# Patient Record
Sex: Female | Born: 1985 | Race: White | Hispanic: No | Marital: Married | Smoking: Never smoker
Health system: Southern US, Community
[De-identification: ages and names within clinical notes are randomized; demographics above are authoritative.]

## PROBLEM LIST (undated history)

## (undated) DIAGNOSIS — R634 Abnormal weight loss: Secondary | ICD-10-CM

## (undated) DIAGNOSIS — F32A Depression, unspecified: Secondary | ICD-10-CM

## (undated) DIAGNOSIS — R4189 Other symptoms and signs involving cognitive functions and awareness: Secondary | ICD-10-CM

## (undated) DIAGNOSIS — R42 Dizziness and giddiness: Secondary | ICD-10-CM

## (undated) DIAGNOSIS — R61 Generalized hyperhidrosis: Secondary | ICD-10-CM

## (undated) DIAGNOSIS — D649 Anemia, unspecified: Secondary | ICD-10-CM

## (undated) DIAGNOSIS — R197 Diarrhea, unspecified: Secondary | ICD-10-CM

## (undated) DIAGNOSIS — Z8659 Personal history of other mental and behavioral disorders: Secondary | ICD-10-CM

## (undated) DIAGNOSIS — R5383 Other fatigue: Secondary | ICD-10-CM

## (undated) DIAGNOSIS — R110 Nausea: Secondary | ICD-10-CM

## (undated) DIAGNOSIS — F419 Anxiety disorder, unspecified: Secondary | ICD-10-CM

## (undated) DIAGNOSIS — R0602 Shortness of breath: Secondary | ICD-10-CM

## (undated) DIAGNOSIS — G4700 Insomnia, unspecified: Secondary | ICD-10-CM

## (undated) DIAGNOSIS — R631 Polydipsia: Secondary | ICD-10-CM

## (undated) DIAGNOSIS — R531 Weakness: Secondary | ICD-10-CM

## (undated) HISTORY — DX: Insomnia, unspecified: G47.00

## (undated) HISTORY — DX: Generalized hyperhidrosis: R61

## (undated) HISTORY — DX: Polydipsia: R63.1

## (undated) HISTORY — DX: Diarrhea, unspecified: R19.7

## (undated) HISTORY — DX: Abnormal weight loss: R63.4

## (undated) HISTORY — DX: Weakness: R53.1

## (undated) HISTORY — DX: Other fatigue: R53.83

## (undated) HISTORY — PX: WISDOM TOOTH EXTRACTION: SHX21

## (undated) HISTORY — DX: Other symptoms and signs involving cognitive functions and awareness: R41.89

## (undated) HISTORY — DX: Dizziness and giddiness: R42

## (undated) HISTORY — DX: Shortness of breath: R06.02

## (undated) HISTORY — PX: INTRAOCULAR LENS INSERTION: SHX110

## (undated) HISTORY — DX: Depression, unspecified: F32.A

## (undated) HISTORY — DX: Nausea: R11.0

---

## 2010-11-09 ENCOUNTER — Other Ambulatory Visit (HOSPITAL_COMMUNITY)
Admission: RE | Admit: 2010-11-09 | Discharge: 2010-11-09 | Disposition: A | Discharge status: Home / Self Care | Payer: Self-pay | Source: Ambulatory Visit | Attending: Family Medicine | Admitting: Family Medicine

## 2010-11-09 DIAGNOSIS — Z124 Encounter for screening for malignant neoplasm of cervix: Secondary | ICD-10-CM | POA: Insufficient documentation

## 2011-12-04 ENCOUNTER — Other Ambulatory Visit (HOSPITAL_COMMUNITY)
Admission: RE | Admit: 2011-12-04 | Discharge: 2011-12-04 | Disposition: A | Discharge status: Home / Self Care | Payer: BC Managed Care – PPO | Source: Ambulatory Visit | Attending: Family Medicine | Admitting: Family Medicine

## 2011-12-04 DIAGNOSIS — Z124 Encounter for screening for malignant neoplasm of cervix: Secondary | ICD-10-CM | POA: Insufficient documentation

## 2016-11-08 ENCOUNTER — Encounter (HOSPITAL_COMMUNITY): Payer: Self-pay | Admitting: *Deleted

## 2016-11-08 ENCOUNTER — Ambulatory Visit (HOSPITAL_COMMUNITY)
Admission: EM | Admit: 2016-11-08 | Discharge: 2016-11-08 | Disposition: A | Discharge status: Home / Self Care | Payer: Self-pay | Attending: Family Medicine | Admitting: Family Medicine

## 2016-11-08 DIAGNOSIS — R202 Paresthesia of skin: Secondary | ICD-10-CM

## 2016-11-08 DIAGNOSIS — R200 Anesthesia of skin: Secondary | ICD-10-CM

## 2016-11-08 DIAGNOSIS — S7002XA Contusion of left hip, initial encounter: Secondary | ICD-10-CM

## 2016-11-08 DIAGNOSIS — S161XXA Strain of muscle, fascia and tendon at neck level, initial encounter: Secondary | ICD-10-CM

## 2016-11-08 HISTORY — DX: Anxiety disorder, unspecified: F41.9

## 2016-11-08 MED ORDER — CYCLOBENZAPRINE HCL 5 MG PO TABS: 5.0000 mg | ORAL_TABLET | Freq: Three times a day (TID) | ORAL | 0 refills | Status: DC | PRN

## 2016-11-08 MED ORDER — PREDNISONE 20 MG PO TABS: ORAL_TABLET | ORAL | 0 refills | Status: DC
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
## 2016-11-08 NOTE — Discharge Instructions (Signed)
You appear to have some mild trochanteric bursitis along with muscle straining her neck. Usually problems like the numbness in your forearm gradually resolve over 1-2 weeks.
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
## 2018-11-01 DIAGNOSIS — Z6827 Body mass index (BMI) 27.0-27.9, adult: Secondary | ICD-10-CM | POA: Diagnosis not present

## 2018-11-01 DIAGNOSIS — Z136 Encounter for screening for cardiovascular disorders: Secondary | ICD-10-CM | POA: Diagnosis not present

## 2018-11-01 DIAGNOSIS — Z13220 Encounter for screening for lipoid disorders: Secondary | ICD-10-CM | POA: Diagnosis not present

## 2018-11-01 DIAGNOSIS — Z713 Dietary counseling and surveillance: Secondary | ICD-10-CM | POA: Diagnosis not present

## 2019-04-15 DIAGNOSIS — Z6826 Body mass index (BMI) 26.0-26.9, adult: Secondary | ICD-10-CM | POA: Diagnosis not present

## 2019-04-15 DIAGNOSIS — Z01419 Encounter for gynecological examination (general) (routine) without abnormal findings: Secondary | ICD-10-CM | POA: Diagnosis not present

## 2019-04-15 DIAGNOSIS — Z1389 Encounter for screening for other disorder: Secondary | ICD-10-CM | POA: Diagnosis not present

## 2019-04-15 DIAGNOSIS — Z30432 Encounter for removal of intrauterine contraceptive device: Secondary | ICD-10-CM | POA: Diagnosis not present

## 2019-04-15 DIAGNOSIS — Z130 Encounter for screening for diseases of the blood and blood-forming organs and certain disorders involving the immune mechanism: Secondary | ICD-10-CM | POA: Diagnosis not present

## 2019-05-12 DIAGNOSIS — Z0189 Encounter for other specified special examinations: Secondary | ICD-10-CM | POA: Diagnosis not present

## 2019-05-12 DIAGNOSIS — R319 Hematuria, unspecified: Secondary | ICD-10-CM | POA: Diagnosis not present

## 2019-05-20 ENCOUNTER — Other Ambulatory Visit (HOSPITAL_COMMUNITY)
Admission: RE | Admit: 2019-05-20 | Discharge: 2019-05-20 | Disposition: A | Discharge status: Home / Self Care | Payer: BC Managed Care – PPO | Source: Ambulatory Visit | Attending: Obstetrics and Gynecology | Admitting: Obstetrics and Gynecology

## 2019-05-20 DIAGNOSIS — Z01812 Encounter for preprocedural laboratory examination: Secondary | ICD-10-CM | POA: Diagnosis not present

## 2019-05-20 DIAGNOSIS — Z20828 Contact with and (suspected) exposure to other viral communicable diseases: Secondary | ICD-10-CM | POA: Diagnosis not present

## 2019-05-20 LAB — SARS CORONAVIRUS 2 (TAT 6-24 HRS): SARS Coronavirus 2: NEGATIVE

## 2019-05-22 ENCOUNTER — Encounter (HOSPITAL_BASED_OUTPATIENT_CLINIC_OR_DEPARTMENT_OTHER): Payer: Self-pay | Admitting: *Deleted

## 2019-05-22 ENCOUNTER — Other Ambulatory Visit: Payer: Self-pay

## 2019-05-22 NOTE — Progress Notes (Signed)
Spoke w/ pt via phone for pre-op interview.  Npo after mn.  Arrive at 0730.  Needs cbc and urine preg.  Pt had covid test done 05-20-2019.

## 2019-05-22 NOTE — H&P (Signed)
Joyce Thompson is an 33 y.o. female. She was seen for annual exam in July, had Mirena removed, wants permanent sterility, does not want children.  Pertinent Gynecological History: Last pap: normal Date: 2017 OB History: G0, P0   Menstrual History: Patient's last menstrual period was 05/09/2019 (exact date).    Past Medical History:  Diagnosis Date  . Anemia   . Anxiety   . History of panic attacks     Past Surgical History:  Procedure Laterality Date  . INTRAOCULAR LENS INSERTION Bilateral 2012  approx.  . WISDOM TOOTH EXTRACTION  age 9    History reviewed. No pertinent family history.  Social History:  reports that she has never smoked. She has never used smokeless tobacco. She reports current drug use. Drug: Marijuana. She reports that she does not drink alcohol.  Allergies:  Allergies  Allergen Reactions  . Codeine Itching    "keeps me awake"    No medications prior to admission.    Review of Systems  Respiratory: Negative.   Cardiovascular: Negative.     Height 5\' 9"  (1.753 m), weight 80.7 kg, last menstrual period 05/09/2019. Physical Exam  Constitutional: She appears well-developed and well-nourished.  Neck: Neck supple. No thyromegaly present.  Cardiovascular: Normal rate, regular rhythm and normal heart sounds.  No murmur heard. Respiratory: Effort normal and breath sounds normal. No respiratory distress. She has no wheezes.  GI: Soft. She exhibits no distension and no mass. There is no abdominal tenderness.  Genitourinary:    Vagina and uterus normal.     Genitourinary Comments: No adnexal mass     No results found for this or any previous visit (from the past 24 hour(s)).  No results found.  Assessment/Plan: Desires permanent sterility.  Discussed all options for contraception, surgical procedure, risks, permanency, failure rate.  Will admit for laparoscopic bilateral salpingectomy  Blane Ohara Drey Shaff 05/22/2019, 10:36 PM

## 2019-05-22 NOTE — Anesthesia Preprocedure Evaluation (Addendum)
Anesthesia Evaluation  Patient identified by MRN, date of birth, ID band Patient awake    Reviewed: Allergy & Precautions, NPO status , Patient's Chart, lab work & pertinent test results  Airway Mallampati: II  TM Distance: >3 FB Neck ROM: Full    Dental no notable dental hx. (+) Teeth Intact   Pulmonary neg pulmonary ROS,    Pulmonary exam normal breath sounds clear to auscultation       Cardiovascular Exercise Tolerance: Good negative cardio ROS Normal cardiovascular exam Rhythm:Regular Rate:Normal     Neuro/Psych negative neurological ROS  negative psych ROS   GI/Hepatic negative GI ROS, Neg liver ROS,   Endo/Other  negative endocrine ROS  Renal/GU negative Renal ROS     Musculoskeletal   Abdominal   Peds  Hematology  (+) anemia ,   Anesthesia Other Findings   Reproductive/Obstetrics                            Anesthesia Physical Anesthesia Plan  ASA: I  Anesthesia Plan: General   Post-op Pain Management:    Induction: Intravenous  PONV Risk Score and Plan: 3 and Treatment may vary due to age or medical condition, Dexamethasone and Ondansetron  Airway Management Planned: Oral ETT  Additional Equipment:   Intra-op Plan:   Post-operative Plan: Extubation in OR  Informed Consent: I have reviewed the patients History and Physical, chart, labs and discussed the procedure including the risks, benefits and alternatives for the proposed anesthesia with the patient or authorized representative who has indicated his/her understanding and acceptance.     Dental advisory given  Plan Discussed with:   Anesthesia Plan Comments:        Anesthesia Quick Evaluation

## 2019-05-23 ENCOUNTER — Encounter (HOSPITAL_BASED_OUTPATIENT_CLINIC_OR_DEPARTMENT_OTHER): Payer: Self-pay | Admitting: *Deleted

## 2019-05-23 ENCOUNTER — Ambulatory Visit (HOSPITAL_BASED_OUTPATIENT_CLINIC_OR_DEPARTMENT_OTHER)
Admission: RE | Admit: 2019-05-23 | Discharge: 2019-05-23 | Disposition: A | Discharge status: Home / Self Care | Payer: BC Managed Care – PPO | Attending: Obstetrics and Gynecology | Admitting: Obstetrics and Gynecology

## 2019-05-23 ENCOUNTER — Encounter (HOSPITAL_BASED_OUTPATIENT_CLINIC_OR_DEPARTMENT_OTHER)
Admission: RE | Disposition: A | Discharge status: Home / Self Care | Payer: Self-pay | Source: Home / Self Care | Attending: Obstetrics and Gynecology

## 2019-05-23 ENCOUNTER — Ambulatory Visit (HOSPITAL_BASED_OUTPATIENT_CLINIC_OR_DEPARTMENT_OTHER): Payer: BC Managed Care – PPO | Admitting: Anesthesiology

## 2019-05-23 ENCOUNTER — Other Ambulatory Visit: Payer: Self-pay

## 2019-05-23 DIAGNOSIS — Z885 Allergy status to narcotic agent status: Secondary | ICD-10-CM | POA: Insufficient documentation

## 2019-05-23 DIAGNOSIS — Z302 Encounter for sterilization: Secondary | ICD-10-CM | POA: Diagnosis not present

## 2019-05-23 HISTORY — DX: Personal history of other mental and behavioral disorders: Z86.59

## 2019-05-23 HISTORY — PX: LAPAROSCOPIC BILATERAL SALPINGECTOMY: SHX5889

## 2019-05-23 HISTORY — DX: Anemia, unspecified: D64.9

## 2019-05-23 LAB — CBC
HCT: 38.9 % (ref 36.0–46.0)
Hemoglobin: 12.8 g/dL (ref 12.0–15.0)
MCH: 30.8 pg (ref 26.0–34.0)
MCHC: 32.9 g/dL (ref 30.0–36.0)
MCV: 93.5 fL (ref 80.0–100.0)
Platelets: 210 10*3/uL (ref 150–400)
RBC: 4.16 MIL/uL (ref 3.87–5.11)
RDW: 13.4 % (ref 11.5–15.5)
WBC: 6.1 10*3/uL (ref 4.0–10.5)
nRBC: 0 % (ref 0.0–0.2)

## 2019-05-23 LAB — POCT PREGNANCY, URINE: Preg Test, Ur: NEGATIVE

## 2019-05-23 SURGERY — SALPINGECTOMY, BILATERAL, LAPAROSCOPIC
Anesthesia: General | Site: Abdomen | Laterality: Bilateral

## 2019-05-23 MED ORDER — LACTATED RINGERS IV SOLN
INTRAVENOUS | Status: DC
  Administered 2019-05-23: 08:00:00 via INTRAVENOUS
  Filled 2019-05-23: qty 1000

## 2019-05-23 MED ORDER — MEPERIDINE HCL 25 MG/ML IJ SOLN
6.2500 mg | INTRAMUSCULAR | Status: DC | PRN
  Filled 2019-05-23: qty 1

## 2019-05-23 MED ORDER — ACETAMINOPHEN 10 MG/ML IV SOLN
1000.0000 mg | Freq: Once | INTRAVENOUS | Status: DC | PRN
  Filled 2019-05-23: qty 100

## 2019-05-23 MED ORDER — HYDROMORPHONE HCL 1 MG/ML IJ SOLN
0.2500 mg | INTRAMUSCULAR | Status: DC | PRN
  Filled 2019-05-23: qty 0.5

## 2019-05-23 MED ORDER — ONDANSETRON HCL 4 MG/2ML IJ SOLN
INTRAMUSCULAR | Status: DC | PRN
  Administered 2019-05-23: 4 mg via INTRAVENOUS

## 2019-05-23 MED ORDER — DEXMEDETOMIDINE HCL 200 MCG/2ML IV SOLN
INTRAVENOUS | Status: DC | PRN
  Administered 2019-05-23 (×4): 8 ug via INTRAVENOUS

## 2019-05-23 MED ORDER — LIDOCAINE 2% (20 MG/ML) 5 ML SYRINGE
INTRAMUSCULAR | Status: DC | PRN
  Administered 2019-05-23: 80 mg via INTRAVENOUS

## 2019-05-23 MED ORDER — PROPOFOL 10 MG/ML IV BOLUS
INTRAVENOUS | Status: DC | PRN
  Administered 2019-05-23: 150 mg via INTRAVENOUS

## 2019-05-23 MED ORDER — DEXAMETHASONE SODIUM PHOSPHATE 10 MG/ML IJ SOLN
INTRAMUSCULAR | Status: AC
  Filled 2019-05-23: qty 1

## 2019-05-23 MED ORDER — LIDOCAINE 2% (20 MG/ML) 5 ML SYRINGE
INTRAMUSCULAR | Status: AC
  Filled 2019-05-23: qty 5

## 2019-05-23 MED ORDER — FENTANYL CITRATE (PF) 100 MCG/2ML IJ SOLN
INTRAMUSCULAR | Status: AC
  Filled 2019-05-23: qty 2

## 2019-05-23 MED ORDER — ONDANSETRON HCL 4 MG/2ML IJ SOLN
INTRAMUSCULAR | Status: AC
  Filled 2019-05-23: qty 2

## 2019-05-23 MED ORDER — KETOROLAC TROMETHAMINE 30 MG/ML IJ SOLN
INTRAMUSCULAR | Status: DC | PRN
  Administered 2019-05-23: 30 mg via INTRAVENOUS

## 2019-05-23 MED ORDER — KETOROLAC TROMETHAMINE 30 MG/ML IJ SOLN
INTRAMUSCULAR | Status: AC
  Filled 2019-05-23: qty 1

## 2019-05-23 MED ORDER — WHITE PETROLATUM EX OINT
TOPICAL_OINTMENT | CUTANEOUS | Status: AC
  Filled 2019-05-23: qty 5

## 2019-05-23 MED ORDER — LACTATED RINGERS IV SOLN
INTRAVENOUS | Status: DC
  Administered 2019-05-23: 11:00:00 via INTRAVENOUS
  Filled 2019-05-23: qty 1000

## 2019-05-23 MED ORDER — ACETAMINOPHEN 500 MG PO TABS
ORAL_TABLET | ORAL | Status: AC
  Filled 2019-05-23: qty 2

## 2019-05-23 MED ORDER — GLYCOPYRROLATE PF 0.2 MG/ML IJ SOSY
PREFILLED_SYRINGE | INTRAMUSCULAR | Status: AC
  Filled 2019-05-23: qty 1

## 2019-05-23 MED ORDER — ACETAMINOPHEN 500 MG PO TABS
1000.0000 mg | ORAL_TABLET | Freq: Once | ORAL | Status: AC
  Administered 2019-05-23: 08:00:00 1000 mg via ORAL
  Filled 2019-05-23: qty 2

## 2019-05-23 MED ORDER — HYDROCODONE-ACETAMINOPHEN 7.5-325 MG PO TABS
1.0000 | ORAL_TABLET | Freq: Once | ORAL | Status: DC | PRN
  Filled 2019-05-23: qty 1

## 2019-05-23 MED ORDER — MIDAZOLAM HCL 2 MG/2ML IJ SOLN
INTRAMUSCULAR | Status: DC | PRN
  Administered 2019-05-23: 2 mg via INTRAVENOUS

## 2019-05-23 MED ORDER — FENTANYL CITRATE (PF) 100 MCG/2ML IJ SOLN
INTRAMUSCULAR | Status: DC | PRN
  Administered 2019-05-23 (×2): 50 ug via INTRAVENOUS

## 2019-05-23 MED ORDER — SUGAMMADEX SODIUM 200 MG/2ML IV SOLN
INTRAVENOUS | Status: DC | PRN
  Administered 2019-05-23: 160 mg via INTRAVENOUS

## 2019-05-23 MED ORDER — MIDAZOLAM HCL 2 MG/2ML IJ SOLN
INTRAMUSCULAR | Status: AC
  Filled 2019-05-23: qty 2

## 2019-05-23 MED ORDER — DEXAMETHASONE SODIUM PHOSPHATE 10 MG/ML IJ SOLN
INTRAMUSCULAR | Status: DC | PRN
  Administered 2019-05-23: 5 mg via INTRAVENOUS

## 2019-05-23 MED ORDER — ROCURONIUM BROMIDE 10 MG/ML (PF) SYRINGE
PREFILLED_SYRINGE | INTRAVENOUS | Status: AC
  Filled 2019-05-23: qty 10

## 2019-05-23 MED ORDER — BUPIVACAINE HCL (PF) 0.25 % IJ SOLN
INTRAMUSCULAR | Status: DC | PRN
  Administered 2019-05-23: 5 mL

## 2019-05-23 MED ORDER — GLYCOPYRROLATE PF 0.2 MG/ML IJ SOSY
PREFILLED_SYRINGE | INTRAMUSCULAR | Status: DC | PRN
  Administered 2019-05-23: .2 mg via INTRAVENOUS

## 2019-05-23 MED ORDER — PROMETHAZINE HCL 25 MG/ML IJ SOLN
6.2500 mg | INTRAMUSCULAR | Status: DC | PRN
  Filled 2019-05-23: qty 1

## 2019-05-23 MED ORDER — ROCURONIUM BROMIDE 10 MG/ML (PF) SYRINGE
PREFILLED_SYRINGE | INTRAVENOUS | Status: DC | PRN
  Administered 2019-05-23: 40 mg via INTRAVENOUS

## 2019-05-23 SURGICAL SUPPLY — 31 items
BAG RETRIEVAL 10MM (BASKET)
CATH ROBINSON RED A/P 16FR (CATHETERS) ×3 IMPLANT
COVER WAND RF STERILE (DRAPES) ×3 IMPLANT
DERMABOND ADVANCED (GAUZE/BANDAGES/DRESSINGS) ×2
DERMABOND ADVANCED .7 DNX12 (GAUZE/BANDAGES/DRESSINGS) ×1 IMPLANT
DRSG COVADERM PLUS 2X2 (GAUZE/BANDAGES/DRESSINGS) IMPLANT
DRSG OPSITE POSTOP 3X4 (GAUZE/BANDAGES/DRESSINGS) IMPLANT
DURAPREP 26ML APPLICATOR (WOUND CARE) ×3 IMPLANT
GAUZE 4X4 16PLY RFD (DISPOSABLE) ×3 IMPLANT
GLOVE BIOGEL PI IND STRL 8 (GLOVE) ×1 IMPLANT
GLOVE BIOGEL PI INDICATOR 8 (GLOVE) ×2
GLOVE ORTHO TXT STRL SZ7.5 (GLOVE) ×3 IMPLANT
GOWN STRL REUS W/TWL XL LVL3 (GOWN DISPOSABLE) ×3 IMPLANT
NEEDLE INSUFFLATION 120MM (ENDOMECHANICALS) ×3 IMPLANT
NS IRRIG 1000ML POUR BTL (IV SOLUTION) ×3 IMPLANT
PACK LAPAROSCOPY BASIN (CUSTOM PROCEDURE TRAY) ×3 IMPLANT
PACK TRENDGUARD 450 HYBRID PRO (MISCELLANEOUS) IMPLANT
PROTECTOR NERVE ULNAR (MISCELLANEOUS) ×3 IMPLANT
SET IRRIG TUBING LAPAROSCOPIC (IRRIGATION / IRRIGATOR) ×3 IMPLANT
SHEARS HARMONIC ACE PLUS 36CM (ENDOMECHANICALS) IMPLANT
SUT VICRYL 0 UR6 27IN ABS (SUTURE) IMPLANT
SUT VICRYL 4-0 PS2 18IN ABS (SUTURE) ×3 IMPLANT
SYS BAG RETRIEVAL 10MM (BASKET)
SYSTEM BAG RETRIEVAL 10MM (BASKET) IMPLANT
TOWEL OR 17X26 10 PK STRL BLUE (TOWEL DISPOSABLE) ×3 IMPLANT
TRENDGUARD 450 HYBRID PRO PACK (MISCELLANEOUS)
TROCAR BLADELESS OPT 5 100 (ENDOMECHANICALS) ×3 IMPLANT
TROCAR XCEL BLUNT TIP 100MML (ENDOMECHANICALS) IMPLANT
TROCAR XCEL NON-BLD 11X100MML (ENDOMECHANICALS) ×3 IMPLANT
TUBING EVAC SMOKE HEATED PNEUM (TUBING) ×3 IMPLANT
WARMER LAPAROSCOPE (MISCELLANEOUS) ×3 IMPLANT

## 2019-05-23 NOTE — Anesthesia Procedure Notes (Signed)
Procedure Name: Intubation Date/Time: 05/23/2019 9:13 AM Performed by: Suan Halter, CRNA Pre-anesthesia Checklist: Patient identified, Emergency Drugs available, Suction available and Patient being monitored Patient Re-evaluated:Patient Re-evaluated prior to induction Oxygen Delivery Method: Circle system utilized Preoxygenation: Pre-oxygenation with 100% oxygen Induction Type: IV induction Ventilation: Mask ventilation without difficulty Laryngoscope Size: Mac and 3 Grade View: Grade I Tube type: Oral Tube size: 7.0 mm Number of attempts: 1 Airway Equipment and Method: Stylet and Oral airway Placement Confirmation: ETT inserted through vocal cords under direct vision,  positive ETCO2 and breath sounds checked- equal and bilateral Secured at: 23 cm Tube secured with: Tape Dental Injury: Teeth and Oropharynx as per pre-operative assessment

## 2019-05-23 NOTE — Anesthesia Postprocedure Evaluation (Signed)
Anesthesia Post Note  Patient: Damika Harmon  Procedure(s) Performed: LAPAROSCOPIC BILATERAL SALPINGECTOMY (Bilateral Abdomen)     Patient location during evaluation: PACU Anesthesia Type: General Level of consciousness: awake and alert Pain management: pain level controlled Vital Signs Assessment: post-procedure vital signs reviewed and stable Respiratory status: spontaneous breathing, nonlabored ventilation, respiratory function stable and patient connected to nasal cannula oxygen Cardiovascular status: blood pressure returned to baseline and stable Postop Assessment: no apparent nausea or vomiting Anesthetic complications: no    Last Vitals:  Vitals:   05/23/19 1045 05/23/19 1115  BP: 112/75 118/83  Pulse: (!) 58 (!) 56  Resp: 12 16  Temp:    SpO2: 100% 100%    Last Pain:  Vitals:   05/23/19 1115  TempSrc:   PainSc: 4                  Barnet Glasgow

## 2019-05-23 NOTE — Discharge Instructions (Signed)
Routine instructions for laparoscopy  DISCHARGE INSTRUCTIONS: Laparoscopy  The following instructions have been prepared to help you care for yourself upon your return home today.  Wound care:  Do not get the incision wet for the first 24 hours. The incision should be kept clean and dry.  The Band-Aids or dressings may be removed the day after surgery.  Should the incision become sore, red, and swollen after the first week, check with your doctor.  Personal hygiene:  Shower the day after your procedure.  Activity and limitations:  Do NOT drive or operate any equipment today.  Do NOT lift anything more than 15 pounds for 2-3 weeks after surgery.  Do NOT rest in bed all day.  Walking is encouraged. Walk each day, starting slowly with 5-minute walks 3 or 4 times a day. Slowly increase the length of your walks.  Walk up and down stairs slowly.  Do NOT do strenuous activities, such as golfing, playing tennis, bowling, running, biking, weight lifting, gardening, mowing, or vacuuming for 2-4 weeks. Ask your doctor when it is okay to start.  Diet: Eat a light meal as desired this evening. You may resume your usual diet tomorrow.  Return to work: This is dependent on the type of work you do. For the most part you can return to a desk job within a week of surgery. If you are more active at work, please discuss this with your doctor.  What to expect after your surgery: You may have a slight burning sensation when you urinate on the first day. You may have a very small amount of blood in the urine. Expect to have a small amount of vaginal discharge/light bleeding for 1-2 weeks. It is not unusual to have abdominal soreness and bruising for up to 2 weeks. You may be tired and need more rest for about 1 week. You may experience shoulder pain for 24-72 hours. Lying flat in bed may relieve it.  Call your doctor for any of the following:  Develop a fever of 100.4 or greater  Inability to  urinate 6 hours after discharge from hospital  Severe pain not relieved by pain medications  Persistent of heavy bleeding at incision site  Redness or swelling around incision site after a week  Increasing nausea or vomiting  Patient Signature________________________________________ Nurse Signature_________________________________________     Post Anesthesia Home Care Instructions  Activity: Get plenty of rest for the remainder of the day. A responsible individual must stay with you for 24 hours following the procedure.  For the next 24 hours, DO NOT: -Drive a car -Advertising copywriterperate machinery -Drink alcoholic beverages -Take any medication unless instructed by your physician -Make any legal decisions or sign important papers.  Meals: Start with liquid foods such as gelatin or soup. Progress to regular foods as tolerated. Avoid greasy, spicy, heavy foods. If nausea and/or vomiting occur, drink only clear liquids until the nausea and/or vomiting subsides. Call your physician if vomiting continues.  Special Instructions/Symptoms: Your throat may feel dry or sore from the anesthesia or the breathing tube placed in your throat during surgery. If this causes discomfort, gargle with warm salt water. The discomfort should disappear within 24 hours.  If you had a scopolamine patch placed behind your ear for the management of post- operative nausea and/or vomiting:  1. The medication in the patch is effective for 72 hours, after which it should be removed.  Wrap patch in a tissue and discard in the trash. Wash hands thoroughly with  soap and water. 2. You may remove the patch earlier than 72 hours if you experience unpleasant side effects which may include dry mouth, dizziness or visual disturbances. 3. Avoid touching the patch. Wash your hands with soap and water after contact with the patch.     NO IBUPROFEN PRODUCTS (MOTRIN, ADVIL) OR ALEVE UNTIL 4:00PM TODAY.

## 2019-05-23 NOTE — Transfer of Care (Signed)
Immediate Anesthesia Transfer of Care Note  Patient: Joyce Thompson  Procedure(s) Performed: Procedure(s) (LRB): LAPAROSCOPIC BILATERAL SALPINGECTOMY (Bilateral)  Patient Location: PACU  Anesthesia Type: General  Level of Consciousness: awake, oriented, sedated and patient cooperative  Airway & Oxygen Therapy: Patient Spontanous Breathing and Patient connected to face mask oxygen  Post-op Assessment: Report given to PACU RN and Post -op Vital signs reviewed and stable  Post vital signs: Reviewed and stable  Complications: No apparent anesthesia complications  Last Vitals:  Vitals Value Taken Time  BP 113/76 05/23/19 1015  Temp    Pulse 66 05/23/19 1017  Resp 14 05/23/19 1017  SpO2 100 % 05/23/19 1017  Vitals shown include unvalidated device data.  Last Pain:  Vitals:   05/23/19 0738  TempSrc: Oral

## 2019-05-23 NOTE — Op Note (Signed)
Preoperative diagnosis: Desires surgical sterility Postoperative diagnosis: Same Procedure: Laparoscopic bilateral salpingectomy Surgeon: Cheri Fowler M.D. Anesthesia: Gen. Endotracheal tube Findings: She had a normal abdomen and pelvis with normal uterus tubes and ovaries Specimens: Bilateral fallopian tubes Estimated blood loss: Minimal Complications: None  Procedure in detail  The patient was taken to the operating room and placed in the dorsosupine position. General anesthesia was induced. Her legs were placed in mobile stirrups and her left arm was tucked to her side. Abdomen perineum and vagina were then prepped and draped in the usual sterile fashion, bladder drained with a red robinson catheter, a Hulka tenaculum was applied to the cervix for uterine manipulation. Infraumbilical skin was then infiltrated with quarter percent Marcaine and a 1 cm vertical incision was made. The veress needle was inserted into the peritoneal cavity and placement confirmed by the water drop test and an opening pressure of 7 mm of mercury. CO2 was insufflated to a pressure of 14 mm of mercury and the veress needle was removed. A 10/11 disposable trocar was then introduced with direct visualization with the laparoscope. A 5 mm port was then placed low in the midline also under direct visualization. Inspection revealed the above-mentioned findings with normal anatomy. The distal end of each tube was grasped and elevated. Using bipolar cautery I was able to free the distal end of each tube from the ovary and fulgurate across the mesosalpinx and a proximal portion of the fallopian tube. Scissors were then used to remove the fallopian tube. This is done bilaterally without difficulty. Both segments of tube were removed through the 73mm trocar. The 5 mm port was removed under direct visualization. All gas was allowed to deflate from the abdomen and the umbilical trocar was removed. One figure-of-eight suture of 0 Vicryl  was placed in the umbilical incision. Skin incisions were then closed with interrupted subcuticular sutures of 4-0 Vicryl followed by Dermabond. The Hulka tenaculum was removed. The patient was taken down from stirrups. She was awakened in the operating room and taken to the recovery room in stable condition after tolerating the procedure well. Counts were correct and she had PAS hose on throughout the procedure.

## 2019-05-23 NOTE — Interval H&P Note (Signed)
History and Physical Interval Note:  05/23/2019 8:54 AM  Joyce Thompson  has presented today for surgery, with the diagnosis of sterilization.  The various methods of treatment have been discussed with the patient and family. After consideration of risks, benefits and other options for treatment, the patient has consented to  Procedure(s): LAPAROSCOPIC BILATERAL SALPINGECTOMY (Bilateral) as a surgical intervention.  The patient's history has been reviewed, patient examined, no change in status, stable for surgery.  I have reviewed the patient's chart and labs.  Questions were answered to the patient's satisfaction.     Blane Ohara Afrah Burlison

## 2019-05-26 ENCOUNTER — Encounter (HOSPITAL_BASED_OUTPATIENT_CLINIC_OR_DEPARTMENT_OTHER): Payer: Self-pay | Admitting: Obstetrics and Gynecology

## 2020-03-17 DIAGNOSIS — M5412 Radiculopathy, cervical region: Secondary | ICD-10-CM | POA: Diagnosis not present

## 2020-03-17 DIAGNOSIS — M5416 Radiculopathy, lumbar region: Secondary | ICD-10-CM | POA: Diagnosis not present

## 2020-03-23 ENCOUNTER — Other Ambulatory Visit: Payer: Self-pay | Admitting: Family Medicine

## 2020-03-23 DIAGNOSIS — M5412 Radiculopathy, cervical region: Secondary | ICD-10-CM

## 2020-03-23 DIAGNOSIS — M5416 Radiculopathy, lumbar region: Secondary | ICD-10-CM

## 2020-03-29 DIAGNOSIS — M542 Cervicalgia: Secondary | ICD-10-CM | POA: Diagnosis not present

## 2020-03-29 DIAGNOSIS — M545 Low back pain: Secondary | ICD-10-CM | POA: Diagnosis not present

## 2020-04-24 ENCOUNTER — Ambulatory Visit
Admission: RE | Admit: 2020-04-24 | Discharge: 2020-04-24 | Disposition: A | Discharge status: Home / Self Care | Payer: BC Managed Care – PPO | Source: Ambulatory Visit | Attending: Family Medicine | Admitting: Family Medicine

## 2020-04-24 DIAGNOSIS — M4802 Spinal stenosis, cervical region: Secondary | ICD-10-CM | POA: Diagnosis not present

## 2020-04-24 DIAGNOSIS — M5412 Radiculopathy, cervical region: Secondary | ICD-10-CM

## 2020-04-24 DIAGNOSIS — R200 Anesthesia of skin: Secondary | ICD-10-CM | POA: Diagnosis not present

## 2020-04-24 DIAGNOSIS — M5416 Radiculopathy, lumbar region: Secondary | ICD-10-CM

## 2020-04-24 DIAGNOSIS — M5117 Intervertebral disc disorders with radiculopathy, lumbosacral region: Secondary | ICD-10-CM | POA: Diagnosis not present

## 2020-04-24 DIAGNOSIS — M4050 Lordosis, unspecified, site unspecified: Secondary | ICD-10-CM | POA: Diagnosis not present

## 2020-04-26 DIAGNOSIS — M542 Cervicalgia: Secondary | ICD-10-CM | POA: Diagnosis not present

## 2020-04-26 DIAGNOSIS — L309 Dermatitis, unspecified: Secondary | ICD-10-CM | POA: Diagnosis not present

## 2020-04-26 DIAGNOSIS — M545 Low back pain: Secondary | ICD-10-CM | POA: Diagnosis not present

## 2020-05-04 DIAGNOSIS — M5412 Radiculopathy, cervical region: Secondary | ICD-10-CM | POA: Diagnosis not present

## 2020-05-04 DIAGNOSIS — M5416 Radiculopathy, lumbar region: Secondary | ICD-10-CM | POA: Diagnosis not present

## 2020-05-11 DIAGNOSIS — M5412 Radiculopathy, cervical region: Secondary | ICD-10-CM | POA: Diagnosis not present

## 2020-05-13 DIAGNOSIS — M5416 Radiculopathy, lumbar region: Secondary | ICD-10-CM | POA: Diagnosis not present

## 2020-05-18 DIAGNOSIS — M5412 Radiculopathy, cervical region: Secondary | ICD-10-CM | POA: Diagnosis not present

## 2020-05-20 DIAGNOSIS — M5416 Radiculopathy, lumbar region: Secondary | ICD-10-CM | POA: Diagnosis not present

## 2020-05-24 DIAGNOSIS — L308 Other specified dermatitis: Secondary | ICD-10-CM | POA: Diagnosis not present

## 2020-05-25 DIAGNOSIS — M5412 Radiculopathy, cervical region: Secondary | ICD-10-CM | POA: Diagnosis not present

## 2020-05-27 DIAGNOSIS — M5416 Radiculopathy, lumbar region: Secondary | ICD-10-CM | POA: Diagnosis not present

## 2020-06-01 DIAGNOSIS — M5412 Radiculopathy, cervical region: Secondary | ICD-10-CM | POA: Diagnosis not present

## 2020-06-03 DIAGNOSIS — M5416 Radiculopathy, lumbar region: Secondary | ICD-10-CM | POA: Diagnosis not present

## 2020-06-09 DIAGNOSIS — M5412 Radiculopathy, cervical region: Secondary | ICD-10-CM | POA: Diagnosis not present

## 2020-06-14 DIAGNOSIS — M5416 Radiculopathy, lumbar region: Secondary | ICD-10-CM | POA: Diagnosis not present

## 2020-06-16 DIAGNOSIS — L309 Dermatitis, unspecified: Secondary | ICD-10-CM | POA: Diagnosis not present

## 2020-06-24 DIAGNOSIS — M5416 Radiculopathy, lumbar region: Secondary | ICD-10-CM | POA: Diagnosis not present

## 2020-06-29 DIAGNOSIS — M5416 Radiculopathy, lumbar region: Secondary | ICD-10-CM | POA: Diagnosis not present

## 2020-07-06 DIAGNOSIS — M5416 Radiculopathy, lumbar region: Secondary | ICD-10-CM | POA: Diagnosis not present

## 2020-07-13 DIAGNOSIS — M5416 Radiculopathy, lumbar region: Secondary | ICD-10-CM | POA: Diagnosis not present

## 2020-07-20 DIAGNOSIS — M5416 Radiculopathy, lumbar region: Secondary | ICD-10-CM | POA: Diagnosis not present

## 2020-07-26 DIAGNOSIS — M5416 Radiculopathy, lumbar region: Secondary | ICD-10-CM | POA: Diagnosis not present

## 2020-10-19 DIAGNOSIS — F331 Major depressive disorder, recurrent, moderate: Secondary | ICD-10-CM | POA: Diagnosis not present

## 2021-01-14 IMAGING — MR MR LUMBAR SPINE W/O CM
4 of 5 series · 18 of 48 positions shown · non-contrast
Comparison: None.

CLINICAL DATA: Low back pain extending into left lower extremity.
Pain and numbness for 2 years.

EXAM:
MRI LUMBAR SPINE WITHOUT CONTRAST
TECHNIQUE: Multiplanar, multisequence MR imaging of the lumbar spine was
performed. No intravenous contrast was administered.

[Series 6: T2 · sagittal · 4.0mm · 0.73mm/px · 6 of 15 slices shown (1 of 2)]
[im 1/15]
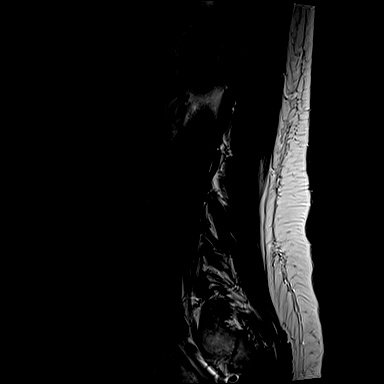
[im 3/15]
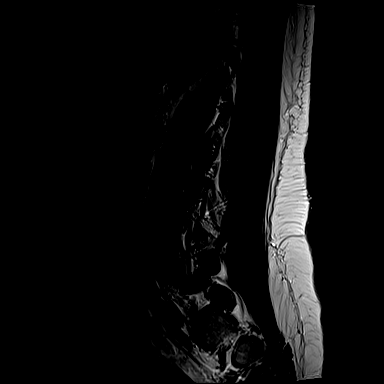
[im 6/15]
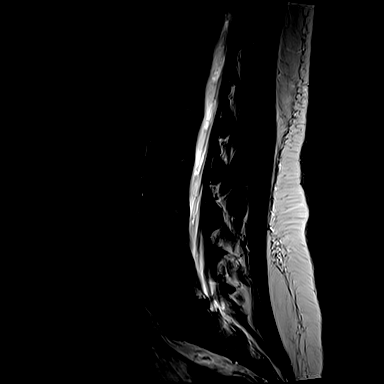
[im 9/15]
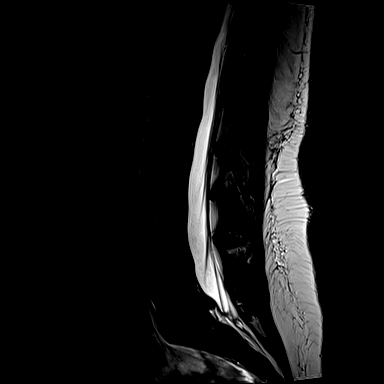
[im 12/15]
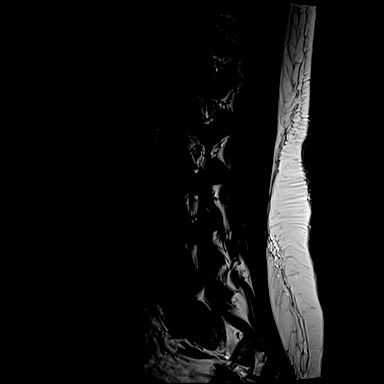
[im 15/15]
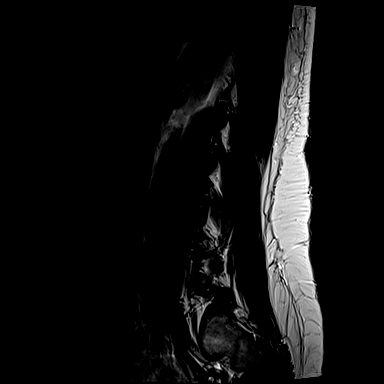

[Series 7: T1 · sagittal · 4.0mm · 0.73mm/px · 3 of 15 slices shown (1 of 2)]
[im 3/15]
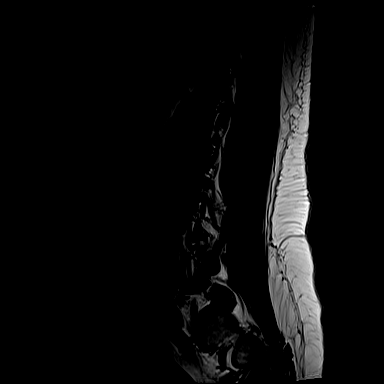
[im 9/15]
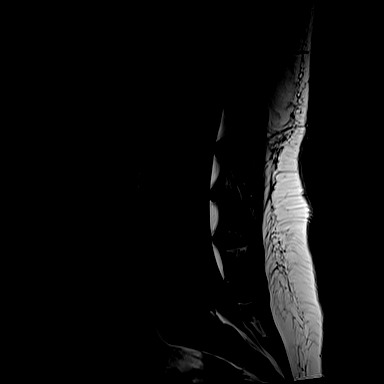
[im 15/15]
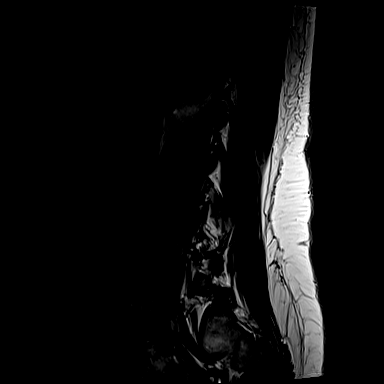

[Series 14: T2 · axial · 4.0mm · 0.28mm/px · z∈[-64,+131]mm · 6 of 44 slices shown (2 of 2)]
[im 3/44]
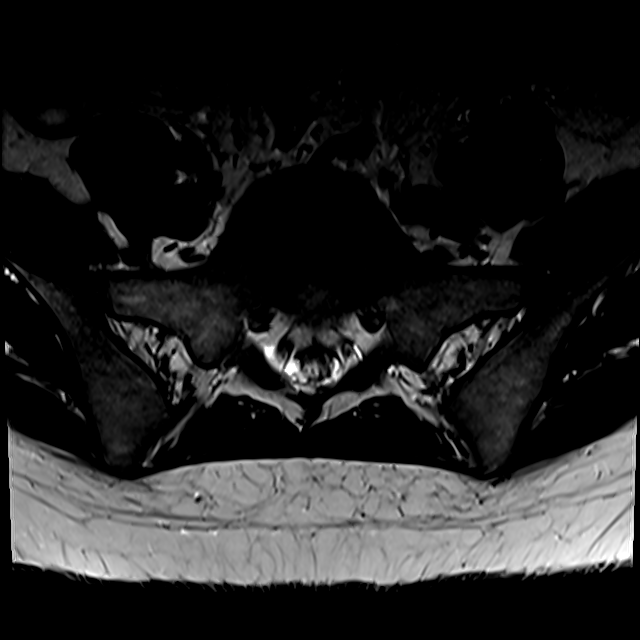
[im 6/44]
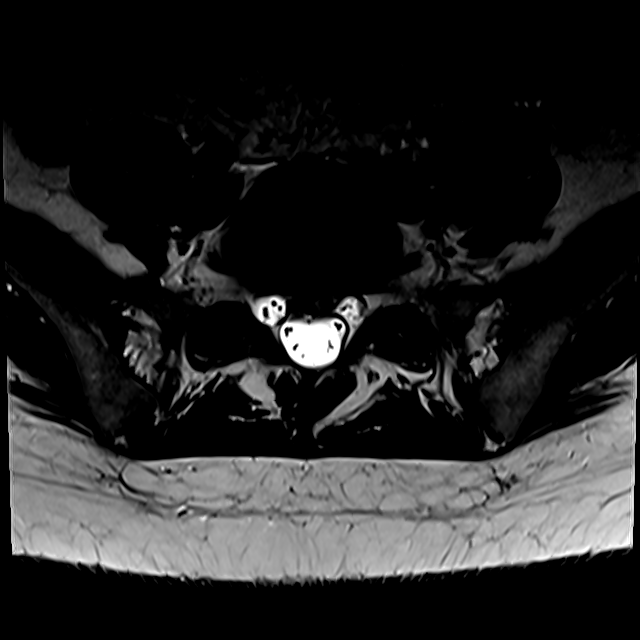
[im 9/44]
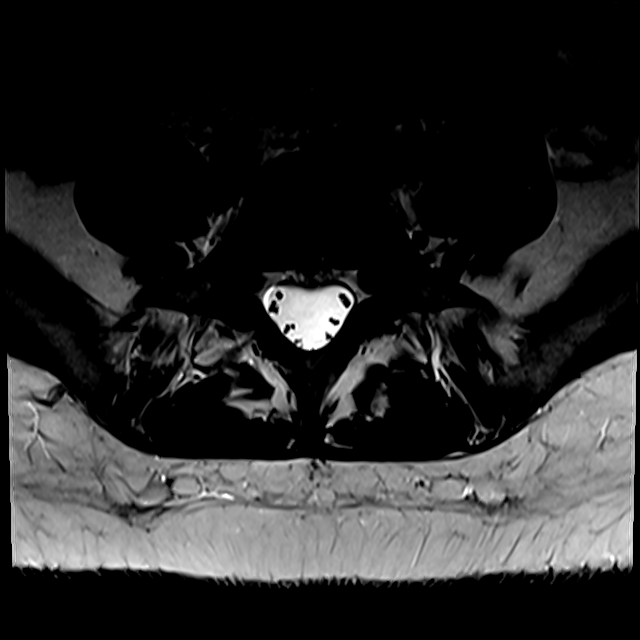
[im 14/44]
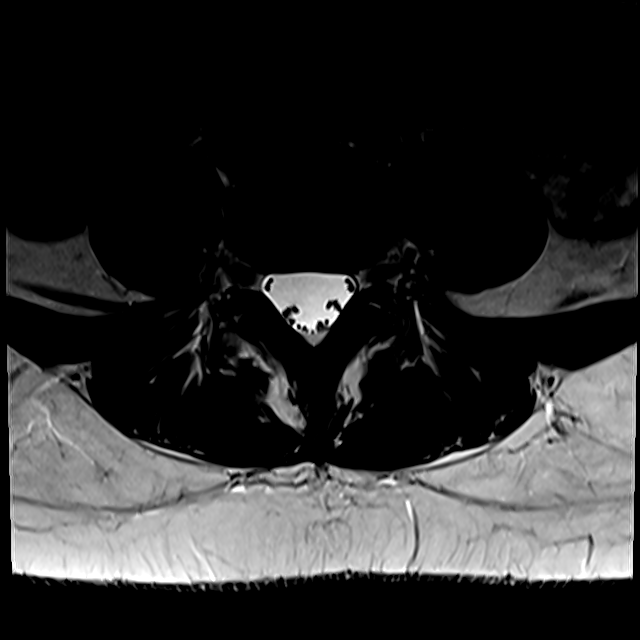
[im 22/44]
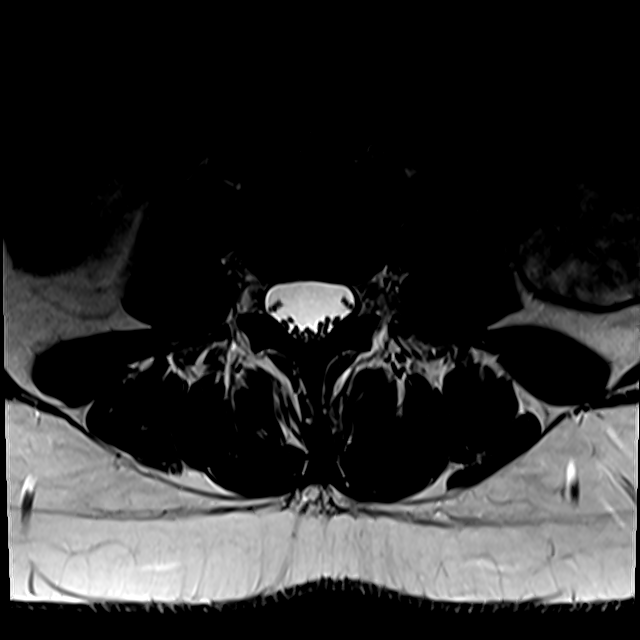
[im 38/44]
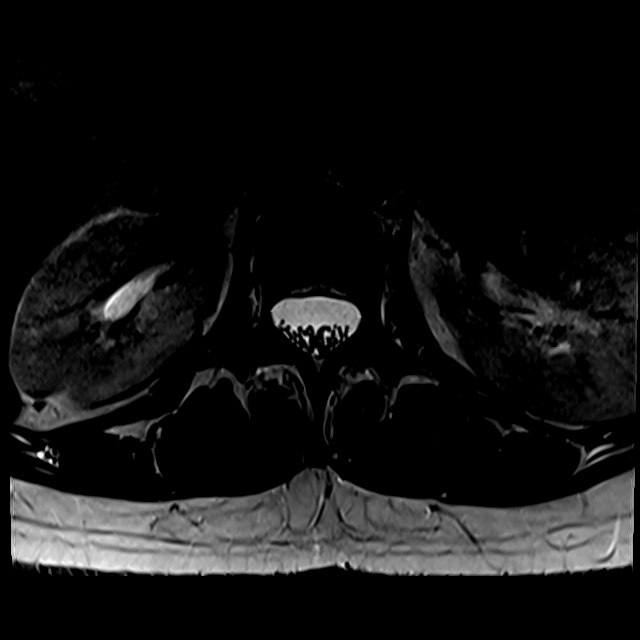

[Series 100: T1 · axial · 4.0mm · 0.28mm/px · z∈[-19,+131]mm · 3 of 32 slices shown (2 of 2)]
[im 6/32]
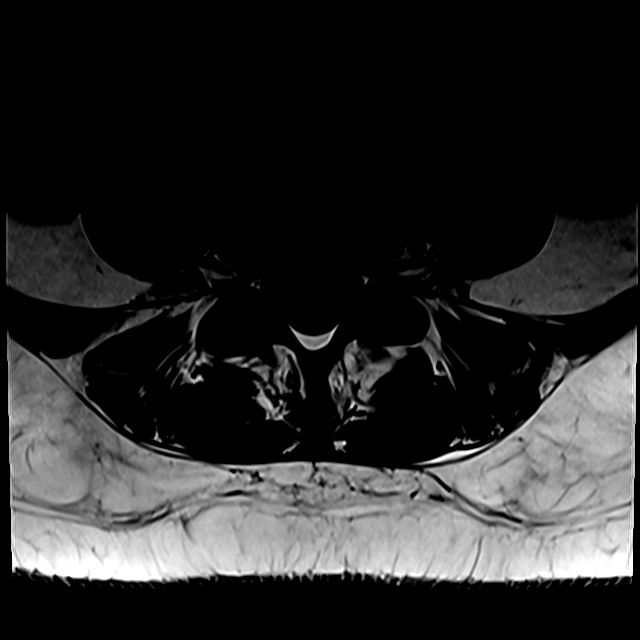
[im 16/32]
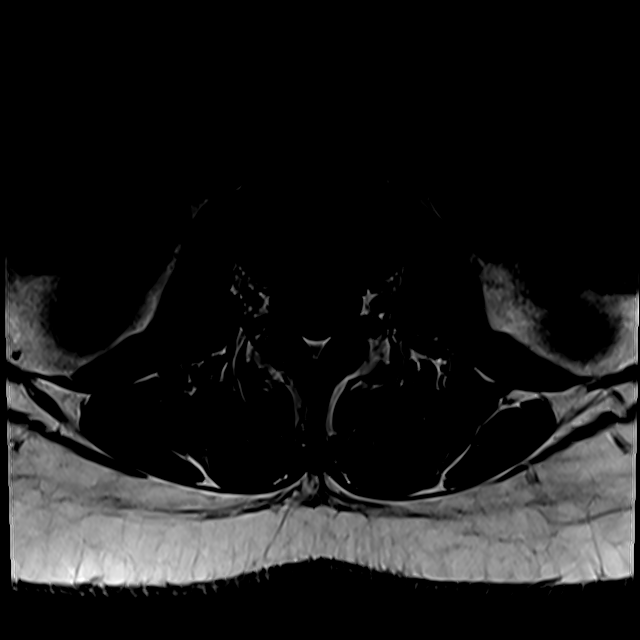
[im 26/32]
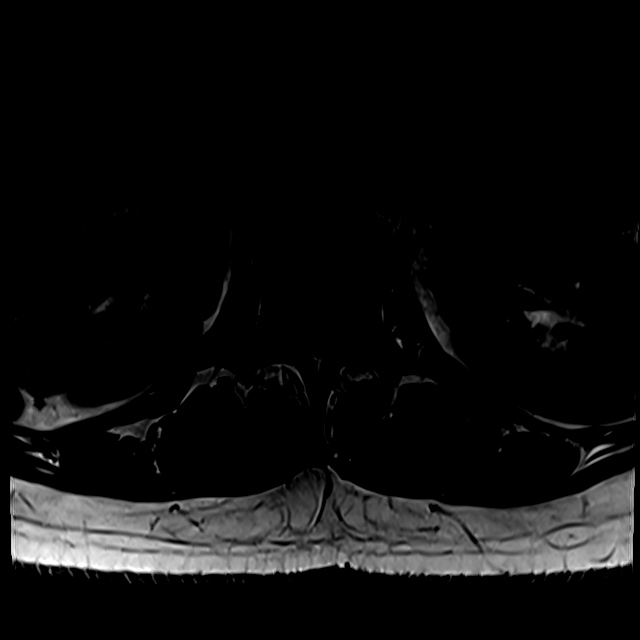

[18 of 48 positions shown; findings below may reference images not displayed]

FINDINGS: Segmentation: 5 non rib-bearing lumbar type vertebral bodies are
present. The lowest fully formed vertebral body is L5.

Alignment: No significant listhesis is present. Lumbar lordosis is
preserved.

Vertebrae:  Mild endplate marrow changes are present at L5-S1.

Conus medullaris and cauda equina: Conus extends to the L1 level.
Conus and cauda equina appear normal.

Paraspinal and other soft tissues: Limited imaging the abdomen is
unremarkable. There is no significant adenopathy. No solid organ
lesions are present.

Disc levels:

Disc levels at L3-4 and above are normal. Minimal desiccation is
present at L4-5 without significant protrusion or stenosis.

L5-S1: A prominent central disc protrusion is present. There is
potential contact of the S1 nerve roots bilaterally. Foramina are
patent bilaterally.
IMPRESSION: 1. Prominent central disc protrusion at L5-S1 with potential contact
of the S1 nerve roots bilaterally.
2. No other significant disc disease.

## 2021-03-02 DIAGNOSIS — F4310 Post-traumatic stress disorder, unspecified: Secondary | ICD-10-CM | POA: Diagnosis not present

## 2021-03-15 DIAGNOSIS — F4310 Post-traumatic stress disorder, unspecified: Secondary | ICD-10-CM | POA: Diagnosis not present

## 2021-03-29 DIAGNOSIS — F4310 Post-traumatic stress disorder, unspecified: Secondary | ICD-10-CM | POA: Diagnosis not present

## 2021-04-13 DIAGNOSIS — F4310 Post-traumatic stress disorder, unspecified: Secondary | ICD-10-CM | POA: Diagnosis not present

## 2021-04-20 DIAGNOSIS — F4310 Post-traumatic stress disorder, unspecified: Secondary | ICD-10-CM | POA: Diagnosis not present

## 2021-05-09 DIAGNOSIS — F4310 Post-traumatic stress disorder, unspecified: Secondary | ICD-10-CM | POA: Diagnosis not present

## 2021-05-18 DIAGNOSIS — F4310 Post-traumatic stress disorder, unspecified: Secondary | ICD-10-CM | POA: Diagnosis not present

## 2021-06-02 DIAGNOSIS — F411 Generalized anxiety disorder: Secondary | ICD-10-CM | POA: Diagnosis not present

## 2021-06-02 DIAGNOSIS — K30 Functional dyspepsia: Secondary | ICD-10-CM | POA: Diagnosis not present

## 2021-06-02 DIAGNOSIS — F410 Panic disorder [episodic paroxysmal anxiety] without agoraphobia: Secondary | ICD-10-CM | POA: Diagnosis not present

## 2021-06-03 DIAGNOSIS — F4310 Post-traumatic stress disorder, unspecified: Secondary | ICD-10-CM | POA: Diagnosis not present

## 2021-06-08 DIAGNOSIS — F4310 Post-traumatic stress disorder, unspecified: Secondary | ICD-10-CM | POA: Diagnosis not present

## 2021-06-21 DIAGNOSIS — F4310 Post-traumatic stress disorder, unspecified: Secondary | ICD-10-CM | POA: Diagnosis not present

## 2021-06-29 DIAGNOSIS — F4310 Post-traumatic stress disorder, unspecified: Secondary | ICD-10-CM | POA: Diagnosis not present

## 2021-07-04 DIAGNOSIS — F411 Generalized anxiety disorder: Secondary | ICD-10-CM | POA: Diagnosis not present

## 2021-07-04 DIAGNOSIS — F410 Panic disorder [episodic paroxysmal anxiety] without agoraphobia: Secondary | ICD-10-CM | POA: Diagnosis not present

## 2021-07-04 DIAGNOSIS — Z23 Encounter for immunization: Secondary | ICD-10-CM | POA: Diagnosis not present

## 2021-07-04 DIAGNOSIS — N943 Premenstrual tension syndrome: Secondary | ICD-10-CM | POA: Diagnosis not present

## 2021-07-05 DIAGNOSIS — F4310 Post-traumatic stress disorder, unspecified: Secondary | ICD-10-CM | POA: Diagnosis not present

## 2021-07-19 DIAGNOSIS — F4310 Post-traumatic stress disorder, unspecified: Secondary | ICD-10-CM | POA: Diagnosis not present

## 2021-07-26 DIAGNOSIS — Z124 Encounter for screening for malignant neoplasm of cervix: Secondary | ICD-10-CM | POA: Diagnosis not present

## 2021-07-26 DIAGNOSIS — Z01419 Encounter for gynecological examination (general) (routine) without abnormal findings: Secondary | ICD-10-CM | POA: Diagnosis not present

## 2021-07-26 DIAGNOSIS — Z130 Encounter for screening for diseases of the blood and blood-forming organs and certain disorders involving the immune mechanism: Secondary | ICD-10-CM | POA: Diagnosis not present

## 2021-07-26 DIAGNOSIS — Z1151 Encounter for screening for human papillomavirus (HPV): Secondary | ICD-10-CM | POA: Diagnosis not present

## 2021-07-27 DIAGNOSIS — F4310 Post-traumatic stress disorder, unspecified: Secondary | ICD-10-CM | POA: Diagnosis not present

## 2021-08-16 DIAGNOSIS — F4312 Post-traumatic stress disorder, chronic: Secondary | ICD-10-CM | POA: Diagnosis not present

## 2021-08-23 DIAGNOSIS — F4312 Post-traumatic stress disorder, chronic: Secondary | ICD-10-CM | POA: Diagnosis not present

## 2021-09-05 DIAGNOSIS — F4312 Post-traumatic stress disorder, chronic: Secondary | ICD-10-CM | POA: Diagnosis not present

## 2021-09-19 DIAGNOSIS — F4312 Post-traumatic stress disorder, chronic: Secondary | ICD-10-CM | POA: Diagnosis not present

## 2021-09-20 DIAGNOSIS — U071 COVID-19: Secondary | ICD-10-CM | POA: Diagnosis not present

## 2021-09-27 DIAGNOSIS — F4312 Post-traumatic stress disorder, chronic: Secondary | ICD-10-CM | POA: Diagnosis not present

## 2021-10-06 DIAGNOSIS — F3281 Premenstrual dysphoric disorder: Secondary | ICD-10-CM | POA: Diagnosis not present

## 2021-10-06 DIAGNOSIS — F4323 Adjustment disorder with mixed anxiety and depressed mood: Secondary | ICD-10-CM | POA: Diagnosis not present

## 2021-10-06 DIAGNOSIS — N926 Irregular menstruation, unspecified: Secondary | ICD-10-CM | POA: Diagnosis not present

## 2021-10-07 DIAGNOSIS — F4312 Post-traumatic stress disorder, chronic: Secondary | ICD-10-CM | POA: Diagnosis not present

## 2021-10-13 DIAGNOSIS — F411 Generalized anxiety disorder: Secondary | ICD-10-CM | POA: Diagnosis not present

## 2021-10-13 DIAGNOSIS — F4312 Post-traumatic stress disorder, chronic: Secondary | ICD-10-CM | POA: Diagnosis not present

## 2021-10-13 DIAGNOSIS — F3281 Premenstrual dysphoric disorder: Secondary | ICD-10-CM | POA: Diagnosis not present

## 2021-10-13 DIAGNOSIS — F840 Autistic disorder: Secondary | ICD-10-CM | POA: Diagnosis not present

## 2021-10-14 DIAGNOSIS — F4312 Post-traumatic stress disorder, chronic: Secondary | ICD-10-CM | POA: Diagnosis not present

## 2021-10-20 DIAGNOSIS — F840 Autistic disorder: Secondary | ICD-10-CM | POA: Diagnosis not present

## 2021-10-20 DIAGNOSIS — F411 Generalized anxiety disorder: Secondary | ICD-10-CM | POA: Diagnosis not present

## 2021-10-20 DIAGNOSIS — F3281 Premenstrual dysphoric disorder: Secondary | ICD-10-CM | POA: Diagnosis not present

## 2021-10-20 DIAGNOSIS — F4312 Post-traumatic stress disorder, chronic: Secondary | ICD-10-CM | POA: Diagnosis not present

## 2021-10-21 DIAGNOSIS — Z13220 Encounter for screening for lipoid disorders: Secondary | ICD-10-CM | POA: Diagnosis not present

## 2021-10-21 DIAGNOSIS — Z136 Encounter for screening for cardiovascular disorders: Secondary | ICD-10-CM | POA: Diagnosis not present

## 2021-10-21 DIAGNOSIS — Z713 Dietary counseling and surveillance: Secondary | ICD-10-CM | POA: Diagnosis not present

## 2021-11-10 DIAGNOSIS — F4312 Post-traumatic stress disorder, chronic: Secondary | ICD-10-CM | POA: Diagnosis not present

## 2021-11-10 DIAGNOSIS — F3281 Premenstrual dysphoric disorder: Secondary | ICD-10-CM | POA: Diagnosis not present

## 2021-11-10 DIAGNOSIS — F411 Generalized anxiety disorder: Secondary | ICD-10-CM | POA: Diagnosis not present

## 2021-11-10 DIAGNOSIS — F840 Autistic disorder: Secondary | ICD-10-CM | POA: Diagnosis not present

## 2021-11-23 DIAGNOSIS — F4323 Adjustment disorder with mixed anxiety and depressed mood: Secondary | ICD-10-CM | POA: Diagnosis not present

## 2021-12-01 DIAGNOSIS — F3281 Premenstrual dysphoric disorder: Secondary | ICD-10-CM | POA: Diagnosis not present

## 2021-12-08 DIAGNOSIS — F411 Generalized anxiety disorder: Secondary | ICD-10-CM | POA: Diagnosis not present

## 2021-12-08 DIAGNOSIS — F3281 Premenstrual dysphoric disorder: Secondary | ICD-10-CM | POA: Diagnosis not present

## 2021-12-08 DIAGNOSIS — F4312 Post-traumatic stress disorder, chronic: Secondary | ICD-10-CM | POA: Diagnosis not present

## 2021-12-08 DIAGNOSIS — F840 Autistic disorder: Secondary | ICD-10-CM | POA: Diagnosis not present

## 2021-12-27 DIAGNOSIS — F4323 Adjustment disorder with mixed anxiety and depressed mood: Secondary | ICD-10-CM | POA: Diagnosis not present

## 2022-01-03 DIAGNOSIS — F4323 Adjustment disorder with mixed anxiety and depressed mood: Secondary | ICD-10-CM | POA: Diagnosis not present

## 2022-01-10 DIAGNOSIS — F4323 Adjustment disorder with mixed anxiety and depressed mood: Secondary | ICD-10-CM | POA: Diagnosis not present

## 2022-01-23 DIAGNOSIS — F4312 Post-traumatic stress disorder, chronic: Secondary | ICD-10-CM | POA: Diagnosis not present

## 2022-01-23 DIAGNOSIS — F840 Autistic disorder: Secondary | ICD-10-CM | POA: Diagnosis not present

## 2022-01-23 DIAGNOSIS — F3281 Premenstrual dysphoric disorder: Secondary | ICD-10-CM | POA: Diagnosis not present

## 2022-01-23 DIAGNOSIS — F411 Generalized anxiety disorder: Secondary | ICD-10-CM | POA: Diagnosis not present

## 2022-01-24 DIAGNOSIS — F4323 Adjustment disorder with mixed anxiety and depressed mood: Secondary | ICD-10-CM | POA: Diagnosis not present

## 2022-01-31 DIAGNOSIS — F4323 Adjustment disorder with mixed anxiety and depressed mood: Secondary | ICD-10-CM | POA: Diagnosis not present

## 2022-02-07 DIAGNOSIS — F4323 Adjustment disorder with mixed anxiety and depressed mood: Secondary | ICD-10-CM | POA: Diagnosis not present

## 2022-02-14 DIAGNOSIS — F4323 Adjustment disorder with mixed anxiety and depressed mood: Secondary | ICD-10-CM | POA: Diagnosis not present

## 2022-02-21 DIAGNOSIS — F4323 Adjustment disorder with mixed anxiety and depressed mood: Secondary | ICD-10-CM | POA: Diagnosis not present

## 2022-02-28 DIAGNOSIS — F4323 Adjustment disorder with mixed anxiety and depressed mood: Secondary | ICD-10-CM | POA: Diagnosis not present

## 2022-03-06 DIAGNOSIS — F4323 Adjustment disorder with mixed anxiety and depressed mood: Secondary | ICD-10-CM | POA: Diagnosis not present

## 2022-03-08 DIAGNOSIS — F4323 Adjustment disorder with mixed anxiety and depressed mood: Secondary | ICD-10-CM | POA: Diagnosis not present

## 2022-03-14 DIAGNOSIS — N921 Excessive and frequent menstruation with irregular cycle: Secondary | ICD-10-CM | POA: Diagnosis not present

## 2022-03-14 DIAGNOSIS — F3281 Premenstrual dysphoric disorder: Secondary | ICD-10-CM | POA: Diagnosis not present

## 2022-03-16 DIAGNOSIS — F4312 Post-traumatic stress disorder, chronic: Secondary | ICD-10-CM | POA: Diagnosis not present

## 2022-03-16 DIAGNOSIS — F840 Autistic disorder: Secondary | ICD-10-CM | POA: Diagnosis not present

## 2022-03-16 DIAGNOSIS — F3281 Premenstrual dysphoric disorder: Secondary | ICD-10-CM | POA: Diagnosis not present

## 2022-03-16 DIAGNOSIS — F411 Generalized anxiety disorder: Secondary | ICD-10-CM | POA: Diagnosis not present

## 2022-03-21 DIAGNOSIS — F411 Generalized anxiety disorder: Secondary | ICD-10-CM | POA: Diagnosis not present

## 2022-03-21 DIAGNOSIS — R42 Dizziness and giddiness: Secondary | ICD-10-CM | POA: Diagnosis not present

## 2022-03-21 DIAGNOSIS — M2480 Other specific joint derangements of unspecified joint, not elsewhere classified: Secondary | ICD-10-CM | POA: Diagnosis not present

## 2022-03-23 DIAGNOSIS — N921 Excessive and frequent menstruation with irregular cycle: Secondary | ICD-10-CM | POA: Diagnosis not present

## 2022-04-21 DIAGNOSIS — F4323 Adjustment disorder with mixed anxiety and depressed mood: Secondary | ICD-10-CM | POA: Diagnosis not present

## 2022-05-08 DIAGNOSIS — F4323 Adjustment disorder with mixed anxiety and depressed mood: Secondary | ICD-10-CM | POA: Diagnosis not present

## 2022-05-29 DIAGNOSIS — F4323 Adjustment disorder with mixed anxiety and depressed mood: Secondary | ICD-10-CM | POA: Diagnosis not present

## 2022-05-30 NOTE — Progress Notes (Unsigned)
Cardiology Office Note:    Date:  05/30/2022   ID:  Joyce Thompson, DOB January 17, 1986, MRN [id number]  PCP:  [facility]   [facility] Providers Cardiologist:  None {  Referring MD: [facility]    History of Present Illness:    Joyce Thompson is a 36 y.o. female with a hx of anxiety and depression who was referred by Dr. Kateri Plummer for further evaluation of lightheadedness.  Was seen by Dr. Kateri Plummer in 05/01/22. Note reviewed. Patient reported significant lightheadedness prompting referral here.    Past Medical History:  Diagnosis Date   Anemia    Anxiety    Brain fog    Depression    Diarrhea    Excessive sweating    Fatigue    History of panic attacks    Increased thirst    Insomnia    Light headedness    Nausea    SOB (shortness of breath)    Weakness    Weight loss     Past Surgical History:  Procedure Laterality Date   INTRAOCULAR LENS INSERTION Bilateral 2012  approx.   LAPAROSCOPIC BILATERAL SALPINGECTOMY Bilateral 05/23/2019   Procedure: LAPAROSCOPIC BILATERAL SALPINGECTOMY;  Surgeon: Lavina Hamman, MD;  Location: [facility];  Service: Gynecology;  Laterality: Bilateral;   WISDOM TOOTH EXTRACTION  age 81    Current Medications: No outpatient medications have been marked as taking for the 06/01/22 encounter (Appointment) with Meriam Sprague, MD.     Allergies:   Codeine   Social History   Socioeconomic History   Marital status: Married    Spouse name: Not on file   Number of children: Not on file   Years of education: Not on file   Highest education level: Not on file  Occupational History   Not on file  Tobacco Use   Smoking status: Never   Smokeless tobacco: Never  Vaping Use   Vaping Use: Never used  Substance and Sexual Activity   Alcohol use: No   Drug use: Yes    Types: Marijuana    Comment: per pt daily 05/22/2019   Sexual activity: Not on file  Other Topics Concern   Not  on file  Social History Narrative   Not on file   Social Determinants of Health   Financial Resource Strain: Not on file  Food Insecurity: Not on file  Transportation Needs: Not on file  Physical Activity: Not on file  Stress: Not on file  Social Connections: Not on file     Family History: The patient's ***family history is not on file.  ROS:   Please see the history of present illness.    *** All other systems reviewed and are negative.  EKGs/Labs/Other Studies Reviewed:    The following studies were reviewed today: ***  EKG:  EKG is *** ordered today.  The ekg ordered today demonstrates ***  Recent Labs: No results found for requested labs within last 365 days.  Recent Lipid Panel No results found for: "CHOL", "TRIG", "HDL", "CHOLHDL", "VLDL", "LDLCALC", "LDLDIRECT"   Risk Assessment/Calculations:   {Does this patient have ATRIAL FIBRILLATION?:[id number]}  No BP recorded.  {Refresh Note OR Click here to enter BP  :1}***         Physical Exam:    VS:  There were no vitals taken for this visit.    Wt Readings from Last 3 Encounters:  05/23/19 178 lb 2 oz (80.8 kg)  GEN: *** Well nourished, well developed in no acute distress HEENT: Normal NECK: No JVD; No carotid bruits LYMPHATICS: No lymphadenopathy CARDIAC: ***RRR, no murmurs, rubs, gallops RESPIRATORY:  Clear to auscultation without rales, wheezing or rhonchi  ABDOMEN: Soft, non-tender, non-distended MUSCULOSKELETAL:  No edema; No deformity  SKIN: Warm and dry NEUROLOGIC:  Alert and oriented x 3 PSYCHIATRIC:  Normal affect   ASSESSMENT:    No diagnosis found. PLAN:    In order of problems listed above:  #Lightheadedness: -Increase hydration -Liberalize salt intake -Small, frequent meals -Compression socks      {Are you ordering a CV Procedure (e.g. stress test, cath, DCCV, TEE, etc)?   Press F2        :[id number]}    Medication Adjustments/Labs and Tests Ordered: Current medicines  are reviewed at length with the patient today.  Concerns regarding medicines are outlined above.  No orders of the defined types were placed in this encounter.  No orders of the defined types were placed in this encounter.   There are no Patient Instructions on file for this visit.   Signed, Meriam Sprague, MD  05/30/2022 8:08 PM    [facility]

## 2022-06-01 ENCOUNTER — Ambulatory Visit (INDEPENDENT_AMBULATORY_CARE_PROVIDER_SITE_OTHER): Payer: BC Managed Care – PPO

## 2022-06-01 ENCOUNTER — Encounter: Payer: Self-pay | Admitting: Cardiology

## 2022-06-01 ENCOUNTER — Ambulatory Visit: Payer: BC Managed Care – PPO | Attending: Cardiology | Admitting: Cardiology

## 2022-06-01 VITALS — BP 138/88 | HR 79 | Ht 69.0 in | Wt 148.8 lb

## 2022-06-01 DIAGNOSIS — R42 Dizziness and giddiness: Secondary | ICD-10-CM | POA: Diagnosis not present

## 2022-06-01 DIAGNOSIS — R002 Palpitations: Secondary | ICD-10-CM

## 2022-06-01 NOTE — Progress Notes (Signed)
Cardiology Office Note:    Date:  06/01/2022   ID:  Joyce Thompson, DOB 1986/06/07, MRN [id number]  PCP:  [facility]   [facility] Providers Cardiologist:  None {  Referring MD: [facility]    History of Present Illness:    Joyce Thompson is a 36 y.o. female with a hx of anxiety and depression who was referred by Dr. Kateri Plummer for further evaluation of lightheadedness.  Was seen by Dr. Kateri Plummer in 05/01/22. Note reviewed. Patient reported significant lightheadedness prompting referral here.  Today, the patient states that starting about a year ago she has been having a horrible time. She is experiencing dizziness/lightheadedness anytime she stands up. Her vision stars and tunnels. At times after crouching, squatting or standing for a long time she has passed out. She is also experiencing a lot of brain fog and fatigue that is limiting her ability to work. Recently she has only been able to work 4 hours a day. Additionally, she may have episodes of her "heart beating funny" when lying down. She is extremely sensitive to hot and cold and unable to regulate back to normal. Due to the small temperature window she can tolerate she is experiencing a lot of sweating. She has been told her symptoms may be PMDD.  She has had these symptoms off and on since puberty. Last Christmas she had COVID and since then has had a significant worsening of her symptoms.   She is already eating a high salt diet to try and manage her symptoms. She states she has followed this diet all her life.   She denies any chest pain, shortness of breath, or peripheral edema. No headaches, orthopnea, or PND.   Orthostatics negative.  Past Medical History:  Diagnosis Date   Anemia    Anxiety    Brain fog    Depression    Diarrhea    Excessive sweating    Fatigue    History of panic attacks    Increased thirst    Insomnia    Light headedness    Nausea    SOB (shortness of  breath)    Weakness    Weight loss     Past Surgical History:  Procedure Laterality Date   INTRAOCULAR LENS INSERTION Bilateral 2012  approx.   LAPAROSCOPIC BILATERAL SALPINGECTOMY Bilateral 05/23/2019   Procedure: LAPAROSCOPIC BILATERAL SALPINGECTOMY;  Surgeon: Lavina Hamman, MD;  Location: [facility];  Service: Gynecology;  Laterality: Bilateral;   WISDOM TOOTH EXTRACTION  age 13    Current Medications: Current Meds  Medication Sig   acetaminophen (TYLENOL) 500 MG tablet Take 500 mg by mouth every 6 (six) hours as needed.   busPIRone (BUSPAR) 10 MG tablet Take 10 mg by mouth 3 (three) times daily.   cetirizine (ZYRTEC) 10 MG tablet Take 10 mg by mouth daily.   ibuprofen (ADVIL) 200 MG tablet Take 200 mg by mouth every 6 (six) hours as needed.   lansoprazole (PREVACID) 15 MG capsule    norethindrone-ethinyl estradiol-iron (LOESTRIN FE) 1.5-30 MG-MCG tablet Take 1 tablet by mouth daily.   traZODone (DESYREL) 50 MG tablet Take 50 mg by mouth at bedtime.     Allergies:   Codeine, Meloxicam, and Remeron [mirtazapine]   Social History   Socioeconomic History   Marital status: Married    Spouse name: Not on file   Number of children: Not on file   Years of education: Not on file   Highest  education level: Not on file  Occupational History   Not on file  Tobacco Use   Smoking status: Never   Smokeless tobacco: Never  Vaping Use   Vaping Use: Never used  Substance and Sexual Activity   Alcohol use: No   Drug use: Yes    Types: Marijuana    Comment: per pt daily 05/22/2019   Sexual activity: Not on file  Other Topics Concern   Not on file  Social History Narrative   Not on file   Social Determinants of Health   Financial Resource Strain: Not on file  Food Insecurity: Not on file  Transportation Needs: Not on file  Physical Activity: Not on file  Stress: Not on file  Social Connections: Not on file     Family History: The patient's family history  is not on file.  ROS:   Review of Systems  Constitutional:  Positive for chills and diaphoresis. Negative for fever.  HENT:  Negative for hearing loss and tinnitus.   Eyes:  Positive for blurred vision (vision stars and then progress to tunnel vision). Negative for double vision.  Respiratory:  Negative for cough and hemoptysis.   Cardiovascular:  Positive for palpitations. Negative for chest pain, orthopnea, claudication, leg swelling and PND.  Genitourinary:  Negative for frequency and urgency.  Musculoskeletal:  Negative for falls.  Neurological:  Positive for dizziness and loss of consciousness. Negative for tingling and tremors.  Psychiatric/Behavioral:  Negative for memory loss. The patient does not have insomnia.      EKGs/Labs/Other Studies Reviewed:    The following studies were reviewed today: No prior cardiovascular studies available.   EKG:  EKG is personally reviewed.  06/01/2022: normal sinus rhythm, heart rate 68  Recent Labs: No results found for requested labs within last 365 days.  Recent Lipid Panel No results found for: "CHOL", "TRIG", "HDL", "CHOLHDL", "VLDL", "LDLCALC", "LDLDIRECT"   Risk Assessment/Calculations:           Physical Exam:    VS:  BP 138/88   Pulse 79   Ht 5\' 9"  (1.753 m)   Wt 148 lb 12.8 oz (67.5 kg)   SpO2 99%   BMI 21.97 kg/m     Wt Readings from Last 3 Encounters:  06/01/22 148 lb 12.8 oz (67.5 kg)  05/23/19 178 lb 2 oz (80.8 kg)     GEN:  Well nourished, well developed in no acute distress HEENT: Normal NECK: No JVD; No carotid bruits CARDIAC: RRR, no murmurs, rubs, gallops RESPIRATORY:  Clear to auscultation without rales, wheezing or rhonchi  ABDOMEN: Soft, non-tender, non-distended MUSCULOSKELETAL:  No edema; No deformity  SKIN: Warm and dry NEUROLOGIC:  Alert and oriented x 3 PSYCHIATRIC:  Normal affect   ASSESSMENT:    1. Lightheadedness   2. Palpitations    PLAN:    In order of problems listed  above:  #Lightheadedness: Suspect she has orthostatic hypotension and possible vasovagal symptoms. Orthostatics negative in the office today but history sounds classic for orthostasis especially given her history. Discussed conservative measures at length today including increasing hydration, increase salt intake, small frequent meals, slow position changes. Suspect her PMDD is also contributing to the severity of her symptoms and she is currently being managed by OBGYN and psych. -Increase hydration -Liberalize salt intake -Small, frequent meals -Compression socks  #Palpitations: Patient reports intermittent fluttering in her chest along with significant other symptoms. Will check 3 day zio monitor to assess further. -Check 3 day zio monitor  Follow Up: PRN  Medication Adjustments/Labs and Tests Ordered: Current medicines are reviewed at length with the patient today.  Concerns regarding medicines are outlined above.  Orders Placed This Encounter  Procedures   LONG TERM MONITOR (3-14 DAYS)   EKG 12-Lead   No orders of the defined types were placed in this encounter.   Patient Instructions  Medication Instructions:   Your physician recommends that you continue on your current medications as directed. Please refer to the Current Medication list given to you today.  *If you need a refill on your cardiac medications before your next appointment, please call your pharmacy*   Testing/Procedures:  ZIO XT- Long Term Monitor Instructions  Your physician has requested you wear a ZIO patch monitor for 3  days.  This is a single patch monitor. [employer] supplies one patch monitor per enrollment. Additional stickers are not available. Please do not apply patch if you will be having a Nuclear Stress Test,  Echocardiogram, Cardiac CT, MRI, or Chest Xray during the period you would be wearing the  monitor. The patch cannot be worn during these tests. You cannot remove and re-apply the   ZIO XT patch monitor.  Your ZIO patch monitor will be mailed 3 day USPS to your address on file. It may take 3-5 days  to receive your monitor after you have been enrolled.  Once you have received your monitor, please review the enclosed instructions. Your monitor  has already been registered assigning a specific monitor serial # to you.  Billing and Patient Assistance Program Information  We have supplied [employer] with any of your insurance information on file for billing purposes. [employer] offers a sliding scale Patient Assistance Program for patients that do not have  insurance, or whose insurance does not completely cover the cost of the ZIO monitor.  You must apply for the Patient Assistance Program to qualify for this discounted rate.  To apply, please call [employer] at [phone], select option 4, select option 2, ask to apply for  Patient Assistance Program. Meredeth Ide will ask your household income, and how many people  are in your household. They will quote your out-of-pocket cost based on that information.  [employer] will also be able to set up a 29-month, interest-free payment plan if needed.  Applying the monitor   Shave hair from upper left chest.  Hold abrader disc by orange tab. Rub abrader in 40 strokes over the upper left chest as  indicated in your monitor instructions.  Clean area with 4 enclosed alcohol pads. Let dry.  Apply patch as indicated in monitor instructions. Patch will be placed under collarbone on left  side of chest with arrow pointing upward.  Rub patch adhesive wings for 2 minutes. Remove white label marked "1". Remove the white  label marked "2". Rub patch adhesive wings for 2 additional minutes.  While looking in a mirror, press and release button in center of patch. A small green light will  flash 3-4 times. This will be your only indicator that the monitor has been turned on.  Do not shower for the first 24 hours. You may shower after the first 24 hours.   Press the button if you feel a symptom. You will hear a small click. Record Date, Time and  Symptom in the Patient Logbook.  When you are ready to remove the patch, follow instructions on the last 2 pages of Patient  Logbook. Stick patch monitor onto the last page of Patient Logbook.  Place  Patient Logbook in the blue and white box. Use locking tab on box and tape box closed  securely. The blue and white box has prepaid postage on it. Please place it in the mailbox as  soon as possible. Your physician should have your test results approximately 7 days after the  monitor has been mailed back to [facility].  Call [facility] Customer Care at [phone] if you have questions regarding  your ZIO XT patch monitor. Call them immediately if you see an orange light blinking on your  monitor.  If your monitor falls off in less than 4 days, contact our Monitor department at [phone].  If your monitor becomes loose or falls off after 4 days call [employer] at [phone] for  suggestions on securing your monitor    Follow-Up:  [name]   Other Instructions  Orthostatic Hypotension Blood pressure is a measurement of how strongly, or weakly, your circulating blood is pressing against the walls of your arteries. Orthostatic hypotension is a drop in blood pressure that can happen when you change positions, such as when you go from lying down to standing. Arteries are blood vessels that carry blood from your heart throughout your body. When blood pressure is too low, you may not get enough blood to your brain or to the rest of your organs. Orthostatic hypotension can cause light-headedness, sweating, rapid heartbeat, blurred vision, and fainting. These symptoms require further investigation into the cause. What are the causes? Orthostatic hypotension can be caused by many things, including: Sudden changes in posture, such as standing up quickly after you have been  sitting or lying down. Loss of blood (anemia) or loss of body fluids (dehydration). Heart problems, neurologic problems, or hormone problems. Pregnancy. Aging. The risk for this condition increases as you get older. Severe infection (sepsis). Certain medicines, such as medicines for high blood pressure or medicines that make the body lose excess fluids (diuretics). What are the signs or symptoms? Symptoms of this condition may include: Weakness, light-headedness, or dizziness. Sweating. Blurred vision. Tiredness (fatigue). Rapid heartbeat. Fainting, in severe cases. How is this diagnosed? This condition is diagnosed based on: Your symptoms and medical history. Your blood pressure measurements. Your health care provider will check your blood pressure when you are: Lying down. Sitting. Standing. A blood pressure reading is recorded as two numbers, such as "120 over 80" (or 120/80). The first ("top") number is called the systolic pressure. It is a measure of the pressure in your arteries as your heart beats. The second ("bottom") number is called the diastolic pressure. It is a measure of the pressure in your arteries when your heart relaxes between beats. Blood pressure is measured in a unit called mmHg. Healthy blood pressure for most adults is 120/80 mmHg. Orthostatic hypotension is defined as a 20 mmHg drop in systolic pressure or a 10 mmHg drop in diastolic pressure within 3 minutes of standing. Other information or tests that may be used to diagnose orthostatic hypotension include: Your other vital signs, such as your heart rate and temperature. Blood tests. An electrocardiogram (ECG) or echocardiogram. A Holter monitor. This is a device you wear that records your heart rhythm continuously, usually for 24-48 hours. Tilt table test. For this test, you will be safely secured to a table that moves you from a lying position to an upright position. Your heart rhythm and blood pressure will  be monitored during the test. How is this treated? This condition may be treated by:  Changing your diet. This may involve eating more salt (sodium) or drinking more water. Changing the dosage of certain medicines you are taking that might be lowering your blood pressure. Correcting the underlying reason for the orthostatic hypotension. Wearing compression stockings. Taking medicines to raise your blood pressure. Avoiding actions that trigger symptoms. Follow these instructions at home: Medicines Take over-the-counter and prescription medicines only as told by your health care provider. Follow instructions from your health care provider about changing the dosage of your current medicines, if this applies. Do not stop or adjust any of your medicines on your own. Eating and drinking Illustration of a person drinking a glass of water.   Drink enough fluid to keep your urine pale yellow. Eat extra salt only as directed. Do not add extra salt to your diet unless advised by your health care provider. Eat frequent, small meals. Avoid standing up suddenly after eating. General instructions Compression stockings on a person's lower legs.   Get up slowly from lying down or sitting positions. This gives your blood pressure a chance to adjust. Avoid hot showers and excessive heat as directed by your health care provider. Engage in regular physical activity as directed by your health care provider. If you have compression stockings, wear them as told. Keep all follow-up visits. This is important. Contact a health care provider if: You have a fever for more than 2-3 days. You feel more thirsty than usual. You feel dizzy or weak. Get help right away if: You have chest pain. You have a fast or irregular heartbeat. You become sweaty or feel light-headed. You feel short of breath. You faint. You have any symptoms of a stroke. "BE FAST" is an easy way to remember the main warning signs of a  stroke: B - Balance. Signs are dizziness, sudden trouble walking, or loss of balance. E - Eyes. Signs are trouble seeing or a sudden change in vision. F - Face. Signs are sudden weakness or numbness of the face, or the face or eyelid drooping on one side. A - Arms. Signs are weakness or numbness in an arm. This happens suddenly and usually on one side of the body. S - Speech. Signs are sudden trouble speaking, slurred speech, or trouble understanding what people say. T - Time. Time to call emergency services. Write down what time symptoms started. You have other signs of a stroke, such as: A sudden, severe headache with no known cause. Nausea or vomiting. Seizure. These symptoms may represent a serious problem that is an emergency. Do not wait to see if the symptoms will go away. Get medical help right away. Call your local emergency services (911 in the [place] Do not drive yourself to the hospital. Summary Orthostatic hypotension is a sudden drop in blood pressure. It can cause light-headedness, sweating, rapid heartbeat, blurred vision, and fainting. Orthostatic hypotension can be diagnosed by having your blood pressure taken while lying down, sitting, and then standing. Treatment may involve changing your diet, wearing compression stockings, sitting up slowly, adjusting your medicines, or correcting the underlying reason for the orthostatic hypotension. Get help right away if you have chest pain, a fast or irregular heartbeat, or symptoms of a stroke. This information is not intended to replace advice given to you by your health care provider. Make sure you discuss any questions you have with your health care provider. Document Revised: 12/02/2020 Document Reviewed: 12/02/2020 Elsevier Patient Education  2023 Elsevier Inc.      Important Information About Sugar  I,Jessica Ford,acting as a Neurosurgeonscribe for Meriam SpragueHeather E Nilton Lave, MD.,have documented all relevant documentation on  the behalf of Meriam SpragueHeather E Ziere Docken, MD,as directed by  Meriam SpragueHeather E Endora Teresi, MD while in the presence of Meriam SpragueHeather E Bunny Kleist, MD.   I, Meriam SpragueHeather E Feliciano Wynter, MD, have reviewed all documentation for this visit. The documentation on 06/01/22 for the exam, diagnosis, procedures, and orders are all accurate and complete.   Signed, Meriam SpragueHeather E Dameon Soltis, MD  06/01/2022 1:01 PM    [facility]

## 2022-06-01 NOTE — Patient Instructions (Addendum)
Medication Instructions:   Your physician recommends that you continue on your current medications as directed. Please refer to the Current Medication list given to you today.  *If you need a refill on your cardiac medications before your next appointment, please call your pharmacy*   Testing/Procedures:  Joyce Thompson Monitor Instructions  Your physician has requested you wear a ZIO patch monitor for 3  days.  This is a single patch monitor. [employer] supplies one patch monitor per enrollment. Additional stickers are not available. Please do not apply patch if you will be having a Nuclear Stress Test,  Echocardiogram, Cardiac CT, MRI, or Chest Xray during the period you would be wearing the  monitor. The patch cannot be worn during these tests. You cannot remove and re-apply the  ZIO XT patch monitor.  Your ZIO patch monitor will be mailed 3 day USPS to your address on file. It may take 3-5 days  to receive your monitor after you have been enrolled.  Once you have received your monitor, please review the enclosed instructions. Your monitor  has already been registered assigning a specific monitor serial # to you.  Billing and Patient Assistance Program Information  We have supplied [employer] with any of your insurance information on file for billing purposes. [employer] offers a sliding scale Patient Assistance Program for patients that do not have  insurance, or whose insurance does not completely cover the cost of the ZIO monitor.  You must apply for the Patient Assistance Program to qualify for this discounted rate.  To apply, please call [employer] at [phone], select option 4, select option 2, ask to apply for  Patient Assistance Program. [name] will ask your household income, and how many people  are in your household. They will quote your out-of-pocket cost based on that information.  [employer] will also be able to set up a 65-month interest-free payment plan if  needed.  Applying the monitor   Shave hair from upper left chest.  Hold abrader disc by orange tab. Rub abrader in 40 strokes over the upper left chest as  indicated in your monitor instructions.  Clean area with 4 enclosed alcohol pads. Let dry.  Apply patch as indicated in monitor instructions. Patch will be placed under collarbone on left  side of chest with arrow pointing upward.  Rub patch adhesive wings for 2 minutes. Remove white label marked "1". Remove the white  label marked "2". Rub patch adhesive wings for 2 additional minutes.  While looking in a mirror, press and release button in center of patch. A small green light will  flash 3-4 times. This will be your only indicator that the monitor has been turned on.  Do not shower for the first 24 hours. You may shower after the first 24 hours.  Press the button if you feel a symptom. You will hear a small click. Record Date, Time and  Symptom in the Patient Logbook.  When you are ready to remove the patch, follow instructions on the last 2 pages of Patient  Logbook. Stick patch monitor onto the last page of Patient Logbook.  Place Patient Logbook in the blue and white box. Use locking tab on box and tape box closed  securely. The blue and white box has prepaid postage on it. Please place it in the mailbox as  soon as possible. Your physician should have your test results approximately 7 days after the  monitor has been [name]  Call [employer] Customer Care at [phone] if you have questions regarding  your ZIO XT patch monitor. Call them immediately if you see an orange light blinking on your  monitor.  If your monitor falls off in less than 4 days, contact our Monitor department at [phone].  If your monitor becomes loose or falls off after 4 days call [employer] at [phone] for  suggestions on securing your monitor    Follow-Up:  [name]   Other  Instructions  Orthostatic Hypotension Blood pressure is a measurement of how strongly, or weakly, your circulating blood is pressing against the walls of your arteries. Orthostatic hypotension is a drop in blood pressure that can happen when you change positions, such as when you go from lying down to standing. Arteries are blood vessels that carry blood from your heart throughout your body. When blood pressure is too low, you may not get enough blood to your brain or to the rest of your organs. Orthostatic hypotension can cause light-headedness, sweating, rapid heartbeat, blurred vision, and fainting. These symptoms require further investigation into the cause. What are the causes? Orthostatic hypotension can be caused by many things, including: Sudden changes in posture, such as standing up quickly after you have been sitting or lying down. Loss of blood (anemia) or loss of body fluids (dehydration). Heart problems, neurologic problems, or hormone problems. Pregnancy. Aging. The risk for this condition increases as you get older. Severe infection (sepsis). Certain medicines, such as medicines for high blood pressure or medicines that make the body lose excess fluids (diuretics). What are the signs or symptoms? Symptoms of this condition may include: Weakness, light-headedness, or dizziness. Sweating. Blurred vision. Tiredness (fatigue). Rapid heartbeat. Fainting, in severe cases. How is this diagnosed? This condition is diagnosed based on: Your symptoms and medical history. Your blood pressure measurements. Your health care provider will check your blood pressure when you are: Lying down. Sitting. Standing. A blood pressure reading is recorded as two numbers, such as "120 over 80" (or 120/80). The first ("top") number is called the systolic pressure. It is a measure of the pressure in your arteries as your heart beats. The second ("bottom") number is called the diastolic pressure. It  is a measure of the pressure in your arteries when your heart relaxes between beats. Blood pressure is measured in a unit called mmHg. Healthy blood pressure for most adults is 120/80 mmHg. Orthostatic hypotension is defined as a 20 mmHg drop in systolic pressure or a 10 mmHg drop in diastolic pressure within 3 minutes of standing. Other information or tests that may be used to diagnose orthostatic hypotension include: Your other vital signs, such as your heart rate and temperature. Blood tests. An electrocardiogram (ECG) or echocardiogram. A Holter monitor. This is a device you wear that records your heart rhythm continuously, usually for 24-48 hours. Tilt table test. For this test, you will be safely secured to a table that moves you from a lying position to an upright position. Your heart rhythm and blood pressure will be monitored during the test. How is this treated? This condition may be treated by: Changing your diet. This may involve eating more salt (sodium) or drinking more water. Changing the dosage of certain medicines you are taking that might be lowering your blood pressure. Correcting the underlying reason for the orthostatic hypotension. Wearing compression stockings. Taking medicines to raise your blood pressure. Avoiding actions that trigger symptoms. Follow these instructions at home: Medicines Take over-the-counter and prescription  medicines only as told by your health care provider. Follow instructions from your health care provider about changing the dosage of your current medicines, if this applies. Do not stop or adjust any of your medicines on your own. Eating and drinking Illustration of a person drinking a glass of water.   Drink enough fluid to keep your urine pale yellow. Eat extra salt only as directed. Do not add extra salt to your diet unless advised by your health care provider. Eat frequent, small meals. Avoid standing up suddenly after eating. General  instructions Compression stockings on a person's lower legs.   Get up slowly from lying down or sitting positions. This gives your blood pressure a chance to adjust. Avoid hot showers and excessive heat as directed by your health care provider. Engage in regular physical activity as directed by your health care provider. If you have compression stockings, wear them as told. Keep all follow-up visits. This is important. Contact a health care provider if: You have a fever for more than 2-3 days. You feel more thirsty than usual. You feel dizzy or weak. Get help right away if: You have chest pain. You have a fast or irregular heartbeat. You become sweaty or feel light-headed. You feel short of breath. You faint. You have any symptoms of a stroke. "BE FAST" is an easy way to remember the main warning signs of a stroke: B - Balance. Signs are dizziness, sudden trouble walking, or loss of balance. E - Eyes. Signs are trouble seeing or a sudden change in vision. F - Face. Signs are sudden weakness or numbness of the face, or the face or eyelid drooping on one side. A - Arms. Signs are weakness or numbness in an arm. This happens suddenly and usually on one side of the body. S - Speech. Signs are sudden trouble speaking, slurred speech, or trouble understanding what people say. T - Time. Time to call emergency services. Write down what time symptoms started. You have other signs of a stroke, such as: A sudden, severe headache with no known cause. Nausea or vomiting. Seizure. These symptoms may represent a serious problem that is an emergency. Do not wait to see if the symptoms will go away. Get medical help right away. Call your local emergency services (911 in the [place] Do not drive yourself to the hospital. Summary Orthostatic hypotension is a sudden drop in blood pressure. It can cause light-headedness, sweating, rapid heartbeat, blurred vision, and fainting. Orthostatic hypotension can  be diagnosed by having your blood pressure taken while lying down, sitting, and then standing. Treatment may involve changing your diet, wearing compression stockings, sitting up slowly, adjusting your medicines, or correcting the underlying reason for the orthostatic hypotension. Get help right away if you have chest pain, a fast or irregular heartbeat, or symptoms of a stroke. This information is not intended to replace advice given to you by your health care provider. Make sure you discuss any questions you have with your health care provider. Document Revised: 12/02/2020 Document Reviewed: 12/02/2020 Elsevier Patient Education  2023 Elsevier Inc.      Important Information About Sugar

## 2022-06-01 NOTE — Progress Notes (Unsigned)
Enrolled for [employer] to mail a ZIO XT long term holter monitor to the patients address on file.  

## 2022-06-06 DIAGNOSIS — R002 Palpitations: Secondary | ICD-10-CM

## 2022-06-08 DIAGNOSIS — Z889 Allergy status to unspecified drugs, medicaments and biological substances status: Secondary | ICD-10-CM | POA: Diagnosis not present

## 2022-06-08 DIAGNOSIS — M2540 Effusion, unspecified joint: Secondary | ICD-10-CM | POA: Diagnosis not present

## 2022-06-08 DIAGNOSIS — R682 Dry mouth, unspecified: Secondary | ICD-10-CM | POA: Diagnosis not present

## 2022-06-08 DIAGNOSIS — R5383 Other fatigue: Secondary | ICD-10-CM | POA: Diagnosis not present

## 2022-06-08 DIAGNOSIS — M25552 Pain in left hip: Secondary | ICD-10-CM | POA: Diagnosis not present

## 2022-06-08 DIAGNOSIS — I7300 Raynaud's syndrome without gangrene: Secondary | ICD-10-CM | POA: Diagnosis not present

## 2022-06-08 DIAGNOSIS — R21 Rash and other nonspecific skin eruption: Secondary | ICD-10-CM | POA: Diagnosis not present

## 2022-06-16 DIAGNOSIS — R002 Palpitations: Secondary | ICD-10-CM | POA: Diagnosis not present

## 2022-06-26 DIAGNOSIS — R21 Rash and other nonspecific skin eruption: Secondary | ICD-10-CM | POA: Diagnosis not present

## 2022-06-26 DIAGNOSIS — E538 Deficiency of other specified B group vitamins: Secondary | ICD-10-CM | POA: Diagnosis not present

## 2022-06-26 DIAGNOSIS — M25552 Pain in left hip: Secondary | ICD-10-CM | POA: Diagnosis not present

## 2022-06-26 DIAGNOSIS — Z889 Allergy status to unspecified drugs, medicaments and biological substances status: Secondary | ICD-10-CM | POA: Diagnosis not present

## 2022-08-02 DIAGNOSIS — N943 Premenstrual tension syndrome: Secondary | ICD-10-CM | POA: Diagnosis not present

## 2022-08-02 DIAGNOSIS — Z3041 Encounter for surveillance of contraceptive pills: Secondary | ICD-10-CM | POA: Diagnosis not present

## 2022-08-08 DIAGNOSIS — I951 Orthostatic hypotension: Secondary | ICD-10-CM | POA: Diagnosis not present

## 2022-08-08 DIAGNOSIS — E538 Deficiency of other specified B group vitamins: Secondary | ICD-10-CM | POA: Diagnosis not present

## 2022-08-08 DIAGNOSIS — M249 Joint derangement, unspecified: Secondary | ICD-10-CM | POA: Diagnosis not present

## 2022-08-08 DIAGNOSIS — E559 Vitamin D deficiency, unspecified: Secondary | ICD-10-CM | POA: Diagnosis not present

## 2022-08-21 DIAGNOSIS — H40023 Open angle with borderline findings, high risk, bilateral: Secondary | ICD-10-CM | POA: Diagnosis not present

## 2022-08-28 DIAGNOSIS — E538 Deficiency of other specified B group vitamins: Secondary | ICD-10-CM | POA: Diagnosis not present

## 2022-09-26 DIAGNOSIS — J3089 Other allergic rhinitis: Secondary | ICD-10-CM | POA: Diagnosis not present

## 2022-09-26 DIAGNOSIS — R21 Rash and other nonspecific skin eruption: Secondary | ICD-10-CM | POA: Diagnosis not present

## 2022-09-26 DIAGNOSIS — R052 Subacute cough: Secondary | ICD-10-CM | POA: Diagnosis not present

## 2022-09-26 DIAGNOSIS — L563 Solar urticaria: Secondary | ICD-10-CM | POA: Diagnosis not present

## 2022-10-18 DIAGNOSIS — Z01419 Encounter for gynecological examination (general) (routine) without abnormal findings: Secondary | ICD-10-CM | POA: Diagnosis not present

## 2022-10-18 DIAGNOSIS — Z1151 Encounter for screening for human papillomavirus (HPV): Secondary | ICD-10-CM | POA: Diagnosis not present

## 2022-10-18 DIAGNOSIS — Z124 Encounter for screening for malignant neoplasm of cervix: Secondary | ICD-10-CM | POA: Diagnosis not present

## 2022-10-25 DIAGNOSIS — Z6823 Body mass index (BMI) 23.0-23.9, adult: Secondary | ICD-10-CM | POA: Diagnosis not present

## 2022-10-25 DIAGNOSIS — Z713 Dietary counseling and surveillance: Secondary | ICD-10-CM | POA: Diagnosis not present

## 2022-10-25 DIAGNOSIS — H40053 Ocular hypertension, bilateral: Secondary | ICD-10-CM | POA: Diagnosis not present

## 2022-10-25 DIAGNOSIS — Z13220 Encounter for screening for lipoid disorders: Secondary | ICD-10-CM | POA: Diagnosis not present

## 2022-11-24 DIAGNOSIS — H40053 Ocular hypertension, bilateral: Secondary | ICD-10-CM | POA: Diagnosis not present

## 2022-12-07 DIAGNOSIS — E538 Deficiency of other specified B group vitamins: Secondary | ICD-10-CM | POA: Diagnosis not present

## 2022-12-07 DIAGNOSIS — F411 Generalized anxiety disorder: Secondary | ICD-10-CM | POA: Diagnosis not present

## 2022-12-07 DIAGNOSIS — M791 Myalgia, unspecified site: Secondary | ICD-10-CM | POA: Diagnosis not present

## 2022-12-07 DIAGNOSIS — E559 Vitamin D deficiency, unspecified: Secondary | ICD-10-CM | POA: Diagnosis not present

## 2022-12-07 DIAGNOSIS — M2480 Other specific joint derangements of unspecified joint, not elsewhere classified: Secondary | ICD-10-CM | POA: Diagnosis not present

## 2023-01-05 DIAGNOSIS — N951 Menopausal and female climacteric states: Secondary | ICD-10-CM | POA: Diagnosis not present

## 2023-01-05 DIAGNOSIS — F3281 Premenstrual dysphoric disorder: Secondary | ICD-10-CM | POA: Diagnosis not present

## 2023-01-19 DIAGNOSIS — N951 Menopausal and female climacteric states: Secondary | ICD-10-CM | POA: Diagnosis not present

## 2023-01-19 DIAGNOSIS — F3281 Premenstrual dysphoric disorder: Secondary | ICD-10-CM | POA: Diagnosis not present

## 2023-03-07 DIAGNOSIS — Z79890 Hormone replacement therapy (postmenopausal): Secondary | ICD-10-CM | POA: Diagnosis not present

## 2023-03-07 DIAGNOSIS — N943 Premenstrual tension syndrome: Secondary | ICD-10-CM | POA: Diagnosis not present

## 2023-03-07 DIAGNOSIS — N951 Menopausal and female climacteric states: Secondary | ICD-10-CM | POA: Diagnosis not present

## 2023-03-17 DIAGNOSIS — H5789 Other specified disorders of eye and adnexa: Secondary | ICD-10-CM | POA: Diagnosis not present

## 2023-04-06 DIAGNOSIS — R21 Rash and other nonspecific skin eruption: Secondary | ICD-10-CM | POA: Diagnosis not present

## 2023-04-10 DIAGNOSIS — F411 Generalized anxiety disorder: Secondary | ICD-10-CM | POA: Diagnosis not present

## 2023-04-10 DIAGNOSIS — M2480 Other specific joint derangements of unspecified joint, not elsewhere classified: Secondary | ICD-10-CM | POA: Diagnosis not present

## 2023-04-10 DIAGNOSIS — E538 Deficiency of other specified B group vitamins: Secondary | ICD-10-CM | POA: Diagnosis not present

## 2023-04-10 DIAGNOSIS — E559 Vitamin D deficiency, unspecified: Secondary | ICD-10-CM | POA: Diagnosis not present

## 2023-04-11 DIAGNOSIS — R052 Subacute cough: Secondary | ICD-10-CM | POA: Diagnosis not present

## 2023-04-11 DIAGNOSIS — R21 Rash and other nonspecific skin eruption: Secondary | ICD-10-CM | POA: Diagnosis not present

## 2023-04-11 DIAGNOSIS — J3089 Other allergic rhinitis: Secondary | ICD-10-CM | POA: Diagnosis not present

## 2023-04-11 DIAGNOSIS — L563 Solar urticaria: Secondary | ICD-10-CM | POA: Diagnosis not present

## 2023-05-14 NOTE — Progress Notes (Unsigned)
New Patient Note  RE: Joyce Thompson MRN: [id number] DOB: 07-10-1986 Date of Office Visit: 05/15/2023  Consult requested by: [facility] Primary care provider: [facility]  Chief Complaint: No chief complaint on file.  History of Present Illness: I had the pleasure of seeing Joyce Thompson for initial evaluation at the [facility] on 05/14/2023. She is a 37 y.o. female, who is referred here by [facility] for the evaluation of ***.  ***  Assessment and Plan: Joyce Thompson is a 37 y.o. female with: There are no diagnoses linked to this encounter.   No follow-ups on file.  No orders of the defined types were placed in this encounter.  Lab Orders  No laboratory test(s) ordered today    Other allergy screening: Asthma: {Blank single:19197::"yes","no"} Rhino conjunctivitis: {Blank single:19197::"yes","no"} Food allergy: {Blank single:19197::"yes","no"} Medication allergy: {Blank single:19197::"yes","no"} Hymenoptera allergy: {Blank single:19197::"yes","no"} Urticaria: {Blank single:19197::"yes","no"} Eczema:{Blank single:19197::"yes","no"} History of recurrent infections suggestive of immunodeficency: {Blank single:19197::"yes","no"}  Diagnostics: Spirometry:  Tracings reviewed. Her effort: {Blank single:19197::"Good reproducible efforts.","It was hard to get consistent efforts and there is a question as to whether this reflects a maximal maneuver.","Poor effort, data can not be interpreted."} FVC: ***L FEV1: ***L, ***% predicted FEV1/FVC ratio: ***% Interpretation: {Blank single:19197::"Spirometry consistent with mild obstructive disease","Spirometry consistent with moderate obstructive disease","Spirometry consistent with severe obstructive disease","Spirometry consistent with possible restrictive disease","Spirometry consistent with mixed obstructive and restrictive disease","Spirometry uninterpretable due  to technique","Spirometry consistent with normal pattern","No overt abnormalities noted given today's efforts"}.  Please see scanned spirometry results for details.  Skin Testing: {Blank single:19197::"Select foods","Environmental allergy panel","Environmental allergy panel and select foods","Food allergy panel","None","Deferred due to recent antihistamines use"}. *** Results discussed with patient/family.   Past Medical History: There are no problems to display for this patient.  Past Medical History:  Diagnosis Date  . Anemia   . Anxiety   . Brain fog   . Depression   . Diarrhea   . Excessive sweating   . Fatigue   . History of panic attacks   . Increased thirst   . Insomnia   . Light headedness   . Nausea   . SOB (shortness of breath)   . Weakness   . Weight loss    Past Surgical History: Past Surgical History:  Procedure Laterality Date  . INTRAOCULAR LENS INSERTION Bilateral 2012  approx.  Marland Kitchen LAPAROSCOPIC BILATERAL SALPINGECTOMY Bilateral 05/23/2019   Procedure: LAPAROSCOPIC BILATERAL SALPINGECTOMY;  Surgeon: Lavina Hamman, MD;  Location: [facility];  Service: Gynecology;  Laterality: Bilateral;  . WISDOM TOOTH EXTRACTION  age 10   Medication List:  Current Outpatient Medications  Medication Sig Dispense Refill  . acetaminophen (TYLENOL) 500 MG tablet Take 500 mg by mouth every 6 (six) hours as needed.    . ALPRAZolam (XANAX) 0.25 MG tablet Take 0.25 mg by mouth at bedtime as needed for anxiety. (Patient not taking: Reported on 06/01/2022)    . busPIRone (BUSPAR) 10 MG tablet Take 10 mg by mouth 3 (three) times daily.    . cetirizine (ZYRTEC) 10 MG tablet Take 10 mg by mouth daily.    . DESVENLAFAXINE SUCCINATE ER PO Take 25 mg by mouth daily. (Patient not taking: Reported on 06/01/2022)    . ibuprofen (ADVIL) 200 MG tablet Take 200 mg by mouth every 6 (six) hours as needed.    . lansoprazole (PREVACID) 15 MG capsule     . norethindrone-ethinyl  estradiol-iron (LOESTRIN FE) 1.5-30  MG-MCG tablet Take 1 tablet by mouth daily. (Patient not taking: Reported on 06/01/2022)    . norethindrone-ethinyl estradiol-iron (LOESTRIN FE) 1.5-30 MG-MCG tablet Take 1 tablet by mouth daily.    . traZODone (DESYREL) 50 MG tablet Take 50 mg by mouth at bedtime.     No current facility-administered medications for this visit.   Allergies: Allergies  Allergen Reactions  . Codeine Itching    "keeps me awake"  . Meloxicam     Rash  . Remeron [Mirtazapine]     "Nightmares"   Social History: Social History   Socioeconomic History  . Marital status: Married    Spouse name: Not on file  . Number of children: Not on file  . Years of education: Not on file  . Highest education level: Not on file  Occupational History  . Not on file  Tobacco Use  . Smoking status: Never  . Smokeless tobacco: Never  Vaping Use  . Vaping status: Never Used  Substance and Sexual Activity  . Alcohol use: No  . Drug use: Yes    Types: Marijuana    Comment: per pt daily 05/22/2019  . Sexual activity: Not on file  Other Topics Concern  . Not on file  Social History Narrative  . Not on file   Social Determinants of Health   Financial Resource Strain: Not on file  Food Insecurity: Not on file  Transportation Needs: Not on file  Physical Activity: Not on file  Stress: Not on file  Social Connections: Not on file   Lives in a ***. Smoking: *** Occupation: ***  Environmental HistorySurveyor, minerals in the house: Copywriter, advertising in the family room: {Blank single:19197::"yes","no"} Carpet in the bedroom: {Blank single:19197::"yes","no"} Heating: {Blank single:19197::"electric","gas","heat pump"} Cooling: {Blank single:19197::"central","window","heat pump"} Pet: {Blank single:19197::"yes ***","no"}  Family History: No family history on file. Problem                               Relation Asthma                                    *** Eczema                                *** Food allergy                          *** Allergic rhino conjunctivitis     ***  Review of Systems  Constitutional:  Negative for appetite change, chills, fever and unexpected weight change.  HENT:  Negative for congestion and rhinorrhea.   Eyes:  Negative for itching.  Respiratory:  Negative for cough, chest tightness, shortness of breath and wheezing.   Cardiovascular:  Negative for chest pain.  Gastrointestinal:  Negative for abdominal pain.  Genitourinary:  Negative for difficulty urinating.  Skin:  Negative for rash.  Neurological:  Negative for headaches.   Objective: There were no vitals taken for this visit. There is no height or weight on file to calculate BMI. Physical Exam Vitals and nursing note reviewed.  Constitutional:      Appearance: Normal appearance. She is well-developed.  HENT:     Head: Normocephalic and atraumatic.     Right Ear: Tympanic membrane and external ear normal.  Left Ear: Tympanic membrane and external ear normal.     Nose: Nose normal.     Mouth/Throat:     Mouth: Mucous membranes are moist.     Pharynx: Oropharynx is clear.  Eyes:     Conjunctiva/sclera: Conjunctivae normal.  Cardiovascular:     Rate and Rhythm: Normal rate and regular rhythm.     Heart sounds: Normal heart sounds. No murmur heard.    No friction rub. No gallop.  Pulmonary:     Effort: Pulmonary effort is normal.     Breath sounds: Normal breath sounds. No wheezing, rhonchi or rales.  Musculoskeletal:     Cervical back: Neck supple.  Skin:    General: Skin is warm.     Findings: No rash.  Neurological:     Mental Status: She is alert and oriented to person, place, and time.  Psychiatric:        Behavior: Behavior normal.  The plan was reviewed with the patient/family, and all questions/concerned were addressed.  It was my pleasure to see Joyce Thompson today and participate in her care. Please feel free to contact me with  any questions or concerns.  Sincerely,  Wyline Mood, DO Allergy & Immunology  [facility] office: [phone] [facility] office: [phone]

## 2023-05-15 ENCOUNTER — Ambulatory Visit: Payer: BC Managed Care – PPO | Admitting: Allergy

## 2023-05-15 ENCOUNTER — Encounter: Payer: Self-pay | Admitting: Allergy

## 2023-05-15 VITALS — BP 106/70 | HR 75 | Temp 97.8°F | Resp 18 | Ht 69.0 in | Wt 157.8 lb

## 2023-05-15 DIAGNOSIS — L2389 Allergic contact dermatitis due to other agents: Secondary | ICD-10-CM

## 2023-05-15 DIAGNOSIS — J3089 Other allergic rhinitis: Secondary | ICD-10-CM

## 2023-05-15 DIAGNOSIS — L309 Dermatitis, unspecified: Secondary | ICD-10-CM

## 2023-05-15 NOTE — Patient Instructions (Addendum)
Rash Concerning for contact dermatitis based on distribution. Avoid Zaditor eye drops, dying hair and benadryl for now.  See below for proper skincare.  No dryer sheets or fabric softener. Recommend patch testing next. This is done in our [place] office. Schedule 4 weeks after your last prednisone pill. Bring samples of both your eye drops, hair dye, coconut oil.  Patches are best placed on Monday with return to office on Wednesday and Friday of same week for readings.  Patches once placed should not get wet.  You do not have to stop any medications for patch testing but should not be on oral prednisone. You can schedule a patch testing visit when convenient for your schedule.    For the itching You can take zyrtec 10mg  twice a day or Claritin 10mg  twice a day.  Environmental allergies See below for environmental control measures for mold. The antihistamines as above should also help with your symptoms.   Follow up for patch testing. Follow up with me for regular check up 1 month after patch testing or sooner if needed.   Skin care recommendations  Bath time: Always use lukewarm water. AVOID very hot or cold water. Keep bathing time to 5-10 minutes. Do NOT use bubble bath. Use a mild soap and use just enough to wash the dirty areas. Do NOT scrub skin vigorously.  After bathing, pat dry your skin with a towel. Do NOT rub or scrub the skin.  Moisturizers and prescriptions:  ALWAYS apply moisturizers immediately after bathing (within 3 minutes). This helps to lock-in moisture. Use the moisturizer several times a day over the whole body. Good summer moisturizers include: Aveeno, CeraVe, Cetaphil. Good winter moisturizers include: Aquaphor, Vaseline, Cerave, Cetaphil, Eucerin, Vanicream. When using moisturizers along with medications, the moisturizer should be applied about one hour after applying the medication to prevent diluting effect of the medication or moisturize around where  you applied the medications. When not using medications, the moisturizer can be continued twice daily as maintenance.  Laundry and clothing: Avoid laundry products with added color or perfumes. Use unscented hypo-allergenic laundry products such as Tide free, Cheer free & gentle, and All free and clear.  If the skin still seems dry or sensitive, you can try double-rinsing the clothes. Avoid tight or scratchy clothing such as wool. Do not use fabric softeners or dyer sheets.  Mold Control Mold and fungi can grow on a variety of surfaces provided certain temperature and moisture conditions exist.  Outdoor molds grow on plants, decaying vegetation and soil. The major outdoor mold, Alternaria and Cladosporium, are found in very high numbers during hot and dry conditions. Generally, a late summer - fall peak is seen for common outdoor fungal spores. Rain will temporarily lower outdoor mold spore count, but counts rise rapidly when the rainy period ends. The most important indoor molds are Aspergillus and Penicillium. Dark, humid and poorly ventilated basements are ideal sites for mold growth. The next most common sites of mold growth are the bathroom and the kitchen. Outdoor (Seasonal) Mold Control Use air conditioning and keep windows closed. Avoid exposure to decaying vegetation. Avoid leaf raking. Avoid grain handling. Consider wearing a face mask if working in moldy areas.  Indoor (Perennial) Mold Control  Maintain humidity below 50%. Get rid of mold growth on hard surfaces with water, detergent and, if necessary, 5% bleach (do not mix with other cleaners). Then dry the area completely. If mold covers an area more than 10 square feet, consider hiring an  indoor environmental professional. For clothing, washing with soap and water is best. If moldy items cannot be cleaned and dried, throw them away. Remove sources e.g. contaminated carpets. Repair and seal leaking roofs or pipes. Using  dehumidifiers in damp basements may be helpful, but empty the water and clean units regularly to prevent mildew from forming. All rooms, especially basements, bathrooms and kitchens, require ventilation and cleaning to deter mold and mildew growth. Avoid carpeting on concrete or damp floors, and storing items in damp areas.

## 2023-06-07 DIAGNOSIS — N951 Menopausal and female climacteric states: Secondary | ICD-10-CM | POA: Diagnosis not present

## 2023-06-07 DIAGNOSIS — Z79890 Hormone replacement therapy (postmenopausal): Secondary | ICD-10-CM | POA: Diagnosis not present

## 2023-06-07 DIAGNOSIS — F3281 Premenstrual dysphoric disorder: Secondary | ICD-10-CM | POA: Diagnosis not present

## 2023-06-07 DIAGNOSIS — L309 Dermatitis, unspecified: Secondary | ICD-10-CM | POA: Diagnosis not present

## 2023-06-12 DIAGNOSIS — L2389 Allergic contact dermatitis due to other agents: Secondary | ICD-10-CM | POA: Diagnosis not present

## 2023-06-28 ENCOUNTER — Encounter: Payer: Self-pay | Admitting: Allergy

## 2023-07-08 NOTE — Progress Notes (Unsigned)
   Follow Up Note  RE: Joyce Thompson MRN: [id number] DOB: 07-Jul-1986 Date of Office Visit: 07/09/2023  Referring provider: Farris Has, MD Primary care provider: Farris Has, MD  History of Present Illness: I had the pleasure of seeing Joyce Thompson for a follow up visit at the [facility] on 07/09/2023. She is a 37 y.o. female, who is being followed for dermatitis. Today she is here for patch test placement, given suspected history of contact dermatitis.   Discussed the use of AI scribe software for clinical note transcription with the patient, who gave verbal consent to proceed.  The patient presents with a persistent rash that has migrated from the eyes to various spots on the body, and now encompasses the entire torso. The rash has been improving slightly over the past few weeks after eliminating coconut from her diet and personal care products. The patient self-tested for allergies to coconut and eye drops, both of which exacerbated her symptoms. 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  The patient has been managing the rash with Cetaphil lotion, as hydrocortisone cream was found to worsen the condition. She has also been taking Zyrtec and Claritin, with the latter providing minimal relief. The patient has been avoiding the use of steroids and antihistamines since the last appointment.  The patient also reported a reaction to brimonidine eye drops for glaucoma, which caused immediate discomfort and redness in the eyes. She has not yet consulted her eye doctor about this issue. The patient also tested her hair dye for potential allergic reactions but reported no adverse effects.  The patient has been struggling to find clear skin for a patch test due to the extensive rash. She suggested using her thigh, which has less rash coverage and no tattoos. However, she was  informed that the leg may not be suitable due to excessive movement. The patient was prescribed triamcinolone at a walk-in clinic, which she reported as significantly improving her condition over a two-week period.   Diagnostics: TRUE Test patches placed.   Assessment and Plan: Rand is a 37 y.o. female with: Allergic contact dermatitis due to other agents TRUE patches placed today - no tegaderm due to adhesive issues. The patient was instructed regarding proper care of the patches for the next 48 hours. Do not get patches wet - avoid showering until the next visit. Do not engage in vigorous physical activity. Patient will follow up in 48 hours and 96 hours for patch readings.  Rash on arm Use triamcinolone 0.1% ointment twice a day as needed for rash flares. Do not use on the face, neck, armpits or groin area. Do not use more than 3 weeks in a row.  Avoid coconut and adhesives.  Follow up with eye doctor regarding your eye drops.  It was my pleasure to see Joyce Thompson today and participate in her care. Please feel free to contact me with any questions or concerns.  Sincerely,  Wyline Mood, DO Allergy & Immunology  [facility] office: [phone] [facility] office: [phone] [place] office: [phone]

## 2023-07-09 ENCOUNTER — Encounter: Payer: Self-pay | Admitting: Allergy

## 2023-07-09 ENCOUNTER — Other Ambulatory Visit: Payer: Self-pay

## 2023-07-09 ENCOUNTER — Ambulatory Visit: Payer: BC Managed Care – PPO | Admitting: Allergy

## 2023-07-09 VITALS — BP 112/60 | HR 78 | Temp 98.8°F | Resp 12 | Wt 166.7 lb

## 2023-07-09 DIAGNOSIS — R21 Rash and other nonspecific skin eruption: Secondary | ICD-10-CM

## 2023-07-09 DIAGNOSIS — L2389 Allergic contact dermatitis due to other agents: Secondary | ICD-10-CM | POA: Diagnosis not present

## 2023-07-09 NOTE — Patient Instructions (Addendum)
Patches placed today. Please avoid strenuous physical activities and do not get the patches on the back wet. No showering until final patch reading done. Okay to take antihistamines for itching but avoid placing any creams on the back where the patches are. We will remove the patches on Wednesday and will do our initial read. Then you will come back on Friday for a final read.   Rash on arm Use triamcinolone 0.1% ointment twice a day as needed for rash flares. Do not use on the face, neck, armpits or groin area. Do not use more than 3 weeks in a row.   Avoid coconut and adhesives.  Follow up with eye doctor regarding your eye drops.

## 2023-07-10 NOTE — Progress Notes (Unsigned)
   Follow Up Note  RE: Joyce Thompson MRN: [id number] DOB: [birth year] Date of Office Visit: 07/11/2023  Referring provider: Farris Has, MD Primary care provider: Farris Has, MD  History of Present Illness: I had the pleasure of seeing Joyce Thompson for a follow up visit at the [facility] on 07/11/2023. She is a 37 y.o. female, who is being followed for dermatitis. Today she is here for initial patch test interpretation, given suspected history of contact dermatitis.   Diagnostics:  TRUE TEST 48 hour reading:  Positive to caine mix. Borderline to p-PHENYLENEDIAMINE, gold.  T.R.U.E. Test - 07/11/23 1000       Test Information   Time Antigen Placed 1052    Lot # [id number]    Location Back    Number of Test 36    Reading Interval Day 3    Panel Panel 1;Panel 2;Panel 3      Panel 1   1. Nickel Sulfate 0    2. Wool Alcohols 0    3. Neomycin Sulfate 0    4. Potassium Dichromate 0    5. Caine Mix 2    6. Fragrance Mix 0    7. Colophony 0    8. Paraben Mix 0    9. Negative Control 0    10. Balsam of [place] 0    11. Ethylenediamine Dihydrochloride 0    12. Cobalt Dichloride 0      Panel 2   13. p-tert Butylphenol Formaldehyde Resin 0    14. Epoxy Resin 0    15. Carba Mix 0    16.  Black Rubber Mix 0    17. Cl+ Me-Isothiazolinone 0    18. Quaternium-15 0    19. Methyldibromo Glutaronitrile 0    20. p-Phenylenediamine --   +/-   21. Formaldehyde 0    22. Mercapto Mix 0    23. Thimerosal 0    24. Thiuram Mix 0      Panel 3   25. Diazolidinyl Urea 0    26. Quinoline Mix 0    27. Tixocortol-21-Pivalate 0    28. Gold Sodium Thiosulfate --   +/-   29. Imidazolidinyl Urea 0    30. Budesonide 0    31. Hydrocortisone-17-Butyrate 0    32. Mercaptobenzothiazole 0    33. Bacitracin 0    34. Parthenolide 0    35. Disperse Blue 106 0    36. 2-Bromo-2-Nitropropane-1,3-diol 0              Assessment and Plan: Joyce Thompson is a 37 y.o. female with: Allergic  contact dermatitis due to other agents TRUE Patches removed and 48 hour reading was Positive to caine mix. Borderline to p-PHENYLENEDIAMINE, gold.. The patient has been provided detailed information regarding the substances she is sensitive to, as well as products containing the substances.  Meticulous avoidance of these substances is recommended.   Return in about 2 days (around 07/13/2023) for Patch reading.  It was my pleasure to see Joyce Thompson today and participate in her care. Please feel free to contact me with any questions or concerns.  Sincerely,  Wyline Mood, DO Allergy & Immunology  [facility] office: [phone] [facility] office: [phone] [place] office: [phone]

## 2023-07-11 ENCOUNTER — Ambulatory Visit (INDEPENDENT_AMBULATORY_CARE_PROVIDER_SITE_OTHER): Payer: BC Managed Care – PPO | Admitting: Allergy

## 2023-07-11 ENCOUNTER — Encounter: Payer: Self-pay | Admitting: Allergy

## 2023-07-11 DIAGNOSIS — L2389 Allergic contact dermatitis due to other agents: Secondary | ICD-10-CM

## 2023-07-11 NOTE — Patient Instructions (Addendum)
Positive to caine mix. Borderline to p-PHENYLENEDIAMINE, gold.  Return for final read on Friday.

## 2023-07-13 ENCOUNTER — Ambulatory Visit: Payer: BC Managed Care – PPO | Admitting: Internal Medicine

## 2023-07-13 DIAGNOSIS — L2389 Allergic contact dermatitis due to other agents: Secondary | ICD-10-CM

## 2023-07-13 NOTE — Progress Notes (Signed)
   Follow Up Note  RE: Zabria Liss MRN: [id number] DOB: Mar 07, 1986 Date of Office Visit: 07/13/2023  Referring provider: Farris Has, MD Primary care provider: Farris Has, MD  History of Present Illness: I had the pleasure of seeing Alima Naser for a follow up visit at the [facility] on 07/13/2023. She is a 37 y.o. female, who is being followed for contact dermatitis. Today she is here for final patch test interpretation, given suspected history of contact dermatitis.   Diagnostics:  TRUE TEST 96 hour reading:   TRUE TEST 96-hour hour reading:  2+ reaction to #5 (Caine mix), 1+ reaction to #20 (p-Phenylene-diamine), 1+ reaction to #28 (Gold sodium thiosulfate), and 1+ reaction to #33 (Bacitracin)      Assessment and Plan: Deane is a 37 y.o. female with: Concern for Contact Dermatitis:  The patient has been provided detailed information regarding the substances she is sensitive to, as well as products containing the substances.  Meticulous avoidance of these substances is recommended. If avoidance is not possible, the use of barrier creams or lotions is recommended. If symptoms persist or progress despite meticulous avoidance of chemicals/substances above, dermatology evaluation may be warranted. Return in about 2 months (around 09/12/2023).  It was my pleasure to see Suellen today and participate in her care. Please feel free to contact me with any questions or concerns.  Sincerely,   Alesia Morin, MD [facility]

## 2023-07-13 NOTE — Patient Instructions (Addendum)
TRUE TEST: Reaction to [facility] mix),  (p-Phenylene-diamine),  (Gold sodium thiosulfate), and (Bacitracin)  Strict avoidance of chemicals listed above.  Will send safe product list through email.   Follow up in 2-3 months with Dr. Selena Batten.

## 2023-08-07 DIAGNOSIS — N951 Menopausal and female climacteric states: Secondary | ICD-10-CM | POA: Diagnosis not present

## 2023-08-07 DIAGNOSIS — F3281 Premenstrual dysphoric disorder: Secondary | ICD-10-CM | POA: Diagnosis not present

## 2023-08-07 DIAGNOSIS — Z79890 Hormone replacement therapy (postmenopausal): Secondary | ICD-10-CM | POA: Diagnosis not present

## 2023-08-21 DIAGNOSIS — E538 Deficiency of other specified B group vitamins: Secondary | ICD-10-CM | POA: Diagnosis not present

## 2023-08-21 DIAGNOSIS — F411 Generalized anxiety disorder: Secondary | ICD-10-CM | POA: Diagnosis not present

## 2023-08-21 DIAGNOSIS — E559 Vitamin D deficiency, unspecified: Secondary | ICD-10-CM | POA: Diagnosis not present

## 2023-11-17 DIAGNOSIS — Z13220 Encounter for screening for lipoid disorders: Secondary | ICD-10-CM | POA: Diagnosis not present

## 2023-11-17 DIAGNOSIS — Z131 Encounter for screening for diabetes mellitus: Secondary | ICD-10-CM | POA: Diagnosis not present

## 2023-11-17 DIAGNOSIS — Z713 Dietary counseling and surveillance: Secondary | ICD-10-CM | POA: Diagnosis not present

## 2023-11-17 DIAGNOSIS — Z136 Encounter for screening for cardiovascular disorders: Secondary | ICD-10-CM | POA: Diagnosis not present

## 2023-11-17 DIAGNOSIS — Z6824 Body mass index (BMI) 24.0-24.9, adult: Secondary | ICD-10-CM | POA: Diagnosis not present

## 2024-01-15 DIAGNOSIS — N951 Menopausal and female climacteric states: Secondary | ICD-10-CM | POA: Diagnosis not present

## 2024-01-15 DIAGNOSIS — Z01419 Encounter for gynecological examination (general) (routine) without abnormal findings: Secondary | ICD-10-CM | POA: Diagnosis not present

## 2024-01-24 DIAGNOSIS — E559 Vitamin D deficiency, unspecified: Secondary | ICD-10-CM | POA: Diagnosis not present

## 2024-01-24 DIAGNOSIS — Z79899 Other long term (current) drug therapy: Secondary | ICD-10-CM | POA: Diagnosis not present

## 2024-01-24 DIAGNOSIS — E538 Deficiency of other specified B group vitamins: Secondary | ICD-10-CM | POA: Diagnosis not present

## 2024-01-24 DIAGNOSIS — F411 Generalized anxiety disorder: Secondary | ICD-10-CM | POA: Diagnosis not present

## 2024-02-08 DIAGNOSIS — E0590 Thyrotoxicosis, unspecified without thyrotoxic crisis or storm: Secondary | ICD-10-CM | POA: Diagnosis not present

## 2024-02-08 DIAGNOSIS — E538 Deficiency of other specified B group vitamins: Secondary | ICD-10-CM | POA: Diagnosis not present

## 2024-02-08 DIAGNOSIS — E559 Vitamin D deficiency, unspecified: Secondary | ICD-10-CM | POA: Diagnosis not present

## 2024-02-08 DIAGNOSIS — R232 Flushing: Secondary | ICD-10-CM | POA: Diagnosis not present

## 2024-03-28 DIAGNOSIS — E0590 Thyrotoxicosis, unspecified without thyrotoxic crisis or storm: Secondary | ICD-10-CM | POA: Diagnosis not present

## 2024-03-28 DIAGNOSIS — R634 Abnormal weight loss: Secondary | ICD-10-CM | POA: Diagnosis not present

## 2024-03-28 DIAGNOSIS — E538 Deficiency of other specified B group vitamins: Secondary | ICD-10-CM | POA: Diagnosis not present

## 2024-03-28 DIAGNOSIS — E559 Vitamin D deficiency, unspecified: Secondary | ICD-10-CM | POA: Diagnosis not present

## 2024-06-06 DIAGNOSIS — E538 Deficiency of other specified B group vitamins: Secondary | ICD-10-CM | POA: Diagnosis not present

## 2024-06-06 DIAGNOSIS — F411 Generalized anxiety disorder: Secondary | ICD-10-CM | POA: Diagnosis not present

## 2024-06-06 DIAGNOSIS — Z23 Encounter for immunization: Secondary | ICD-10-CM | POA: Diagnosis not present

## 2024-06-06 DIAGNOSIS — E559 Vitamin D deficiency, unspecified: Secondary | ICD-10-CM | POA: Diagnosis not present
